# Patient Record
Sex: Male | Born: 1965 | Race: White | Hispanic: No | Marital: Married | Smoking: Current every day smoker
Health system: Southern US, Community
[De-identification: ages and names within clinical notes are randomized; demographics above are authoritative.]

---

## 2010-02-25 ENCOUNTER — Emergency Department: Payer: Self-pay | Admitting: Emergency Medicine

## 2012-10-09 ENCOUNTER — Ambulatory Visit: Payer: Self-pay

## 2013-06-22 ENCOUNTER — Emergency Department: Payer: Self-pay | Admitting: Internal Medicine

## 2013-06-22 LAB — COMPREHENSIVE METABOLIC PANEL
Albumin: 3.7 g/dL (ref 3.4–5.0)
Alkaline Phosphatase: 65 U/L
Anion Gap: 6 — ABNORMAL LOW (ref 7–16)
BUN: 19 mg/dL — ABNORMAL HIGH (ref 7–18)
Bilirubin,Total: 0.5 mg/dL (ref 0.2–1.0)
Co2: 30 mmol/L (ref 21–32)
Creatinine: 1.13 mg/dL (ref 0.60–1.30)
Glucose: 113 mg/dL — ABNORMAL HIGH (ref 65–99)
Potassium: 3.9 mmol/L (ref 3.5–5.1)
SGPT (ALT): 35 U/L (ref 12–78)
Total Protein: 6.6 g/dL (ref 6.4–8.2)

## 2013-06-22 LAB — URINALYSIS, COMPLETE
Bacteria: NONE SEEN
Bilirubin,UR: NEGATIVE
Blood: NEGATIVE
Glucose,UR: NEGATIVE mg/dL (ref 0–75)
Ketone: NEGATIVE
Ph: 8 (ref 4.5–8.0)
Protein: NEGATIVE
Squamous Epithelial: <1

## 2013-06-22 LAB — CBC
HCT: 45.6 % (ref 40.0–52.0)
HGB: 15.9 g/dL (ref 13.0–18.0)
MCHC: 34.9 g/dL (ref 32.0–36.0)
Platelet: 154 10*3/uL (ref 150–440)
RBC: 5.1 10*6/uL (ref 4.40–5.90)
RDW: 12.2 % (ref 11.5–14.5)

## 2013-06-22 LAB — CK TOTAL AND CKMB (NOT AT ARMC): CK-MB: 0.6 ng/mL (ref 0.5–3.6)

## 2013-06-22 LAB — TROPONIN I: Troponin-I: <0.02 ng/mL

## 2013-06-22 LAB — RAPID INFLUENZA A&B ANTIGENS

## 2015-03-19 ENCOUNTER — Other Ambulatory Visit: Payer: Self-pay | Admitting: Physician Assistant

## 2015-03-19 DIAGNOSIS — R1031 Right lower quadrant pain: Secondary | ICD-10-CM

## 2015-03-20 ENCOUNTER — Other Ambulatory Visit: Payer: Self-pay | Admitting: Physician Assistant

## 2015-03-20 ENCOUNTER — Ambulatory Visit
Admission: RE | Admit: 2015-03-20 | Discharge: 2015-03-20 | Disposition: A | Discharge status: Home / Self Care | Payer: Managed Care, Other (non HMO) | Source: Ambulatory Visit | Attending: Physician Assistant | Admitting: Physician Assistant

## 2015-03-20 DIAGNOSIS — R1031 Right lower quadrant pain: Secondary | ICD-10-CM

## 2015-09-16 ENCOUNTER — Other Ambulatory Visit: Payer: Self-pay | Admitting: Internal Medicine

## 2015-09-16 DIAGNOSIS — R0781 Pleurodynia: Secondary | ICD-10-CM

## 2015-09-17 ENCOUNTER — Ambulatory Visit
Admission: RE | Admit: 2015-09-17 | Discharge: 2015-09-17 | Disposition: A | Discharge status: Home / Self Care | Payer: Managed Care, Other (non HMO) | Source: Ambulatory Visit | Attending: Internal Medicine | Admitting: Internal Medicine

## 2015-09-17 ENCOUNTER — Other Ambulatory Visit: Payer: Self-pay | Admitting: Internal Medicine

## 2015-09-17 DIAGNOSIS — R932 Abnormal findings on diagnostic imaging of liver and biliary tract: Secondary | ICD-10-CM | POA: Diagnosis not present

## 2015-09-17 DIAGNOSIS — E782 Mixed hyperlipidemia: Secondary | ICD-10-CM | POA: Insufficient documentation

## 2015-09-17 DIAGNOSIS — R0781 Pleurodynia: Secondary | ICD-10-CM | POA: Diagnosis not present

## 2015-12-04 ENCOUNTER — Encounter: Payer: Self-pay | Admitting: *Deleted

## 2015-12-07 ENCOUNTER — Ambulatory Visit
Admission: RE | Admit: 2015-12-07 | Discharge: 2015-12-07 | Disposition: A | Discharge status: Home / Self Care | Payer: Managed Care, Other (non HMO) | Source: Ambulatory Visit | Attending: Gastroenterology | Admitting: Gastroenterology

## 2015-12-07 ENCOUNTER — Encounter
Admission: RE | Disposition: A | Discharge status: Home / Self Care | Payer: Self-pay | Source: Ambulatory Visit | Attending: Gastroenterology

## 2015-12-07 ENCOUNTER — Encounter: Payer: Self-pay | Admitting: *Deleted

## 2015-12-07 ENCOUNTER — Ambulatory Visit: Payer: Managed Care, Other (non HMO) | Admitting: Certified Registered Nurse Anesthetist

## 2015-12-07 DIAGNOSIS — Z791 Long term (current) use of non-steroidal anti-inflammatories (NSAID): Secondary | ICD-10-CM | POA: Insufficient documentation

## 2015-12-07 DIAGNOSIS — Z79899 Other long term (current) drug therapy: Secondary | ICD-10-CM | POA: Insufficient documentation

## 2015-12-07 DIAGNOSIS — D122 Benign neoplasm of ascending colon: Secondary | ICD-10-CM | POA: Diagnosis not present

## 2015-12-07 DIAGNOSIS — K635 Polyp of colon: Secondary | ICD-10-CM | POA: Insufficient documentation

## 2015-12-07 DIAGNOSIS — F17200 Nicotine dependence, unspecified, uncomplicated: Secondary | ICD-10-CM | POA: Insufficient documentation

## 2015-12-07 DIAGNOSIS — Z1211 Encounter for screening for malignant neoplasm of colon: Secondary | ICD-10-CM | POA: Diagnosis not present

## 2015-12-07 HISTORY — PX: COLONOSCOPY WITH PROPOFOL: SHX5780

## 2015-12-07 SURGERY — COLONOSCOPY WITH PROPOFOL
Anesthesia: General

## 2015-12-07 MED ORDER — SODIUM CHLORIDE 0.9 % IV SOLN
INTRAVENOUS | Status: DC
  Administered 2015-12-07: 10:00:00 via INTRAVENOUS

## 2015-12-07 MED ORDER — PROPOFOL 10 MG/ML IV BOLUS
INTRAVENOUS | Status: DC | PRN
  Administered 2015-12-07: 80 mg via INTRAVENOUS
  Administered 2015-12-07: 20 mg via INTRAVENOUS

## 2015-12-07 MED ORDER — PROPOFOL 500 MG/50ML IV EMUL
INTRAVENOUS | Status: DC | PRN
  Administered 2015-12-07: 160 ug/kg/min via INTRAVENOUS

## 2015-12-07 MED ORDER — SODIUM CHLORIDE 0.9 % IV SOLN: INTRAVENOUS | Status: DC

## 2015-12-07 MED ORDER — LIDOCAINE HCL (CARDIAC) 20 MG/ML IV SOLN
INTRAVENOUS | Status: DC | PRN
  Administered 2015-12-07: 20 mg via INTRAVENOUS

## 2015-12-07 NOTE — Op Note (Signed)
[facility] Gastroenterology Patient Name: Luke Jordan Procedure Date: 12/07/2015 10:31 AM MRN: [id number] Account #: [id number] Date of Birth: January 03, 1966 Admit Type: Outpatient Age: 50 Room: [facility] ENDO ROOM 4 Gender: Male Note Status: Finalized Procedure:            Colonoscopy Indications:          Screening for colorectal malignant neoplasm Providers:            Lupita Dawn. Candace Cruise, MD Referring MD:         Tracie Harrier, MD (Referring MD) Medicines:            Monitored Anesthesia Care Complications:        No immediate complications. Procedure:            Pre-Anesthesia Assessment:                       - Prior to the procedure, a History and Physical was                        performed, and patient medications, allergies and                        sensitivities were reviewed. The patient's tolerance of                        previous anesthesia was reviewed.                       - The risks and benefits of the procedure and the                        sedation options and risks were discussed with the                        patient. All questions were answered and informed                        consent was obtained.                       - After reviewing the risks and benefits, the patient                        was deemed in satisfactory condition to undergo the                        procedure.                       After obtaining informed consent, the colonoscope was                        passed under direct vision. Throughout the procedure,                        the patient's blood pressure, pulse, and oxygen                        saturations were monitored continuously. The  Colonoscope was introduced through the anus and                        advanced to the the cecum, identified by appendiceal                        orifice and ileocecal valve. The colonoscopy was                        performed without difficulty. The  patient tolerated the                        procedure well. The quality of the bowel preparation                        was good. Findings:      A small polyp was found in the proximal ascending colon. The polyp was       sessile. The polyp was removed with a hot snare. Resection and retrieval       were complete.      A few sessile polyps were found in the recto-sigmoid colon. The polyps       were diminutive in size. These polyps were removed with a jumbo cold       forceps. Polyp resection was incomplete. The resected tissue was       retrieved. These polyps in rectosigmoid area appeared to be hyperplastic       in nature      The exam was otherwise normal throughout the examined colon. Impression:           - One small polyp in the proximal ascending colon,                        removed with a hot snare. Resected and retrieved.                       - A few diminutive polyps at the recto-sigmoid colon,                        removed with a jumbo cold forceps. Incomplete                        resection. Resected tissue retrieved. Recommendation:       - Discharge patient to home.                       - Await pathology results.                       - Repeat colonoscopy in 5 years for surveillance based                        on pathology results.                       - The findings and recommendations were discussed with                        the patient. Procedure Code(s):    --- Professional ---  45385, Colonoscopy, flexible; with removal of tumor(s),                        polyp(s), or other lesion(s) by snare technique                       45380, 59, Colonoscopy, flexible; with biopsy, single                        or multiple Diagnosis Code(s):    --- Professional ---                       Z12.11, Encounter for screening for malignant neoplasm                        of colon                       D12.2, Benign neoplasm of ascending colon                        D12.7, Benign neoplasm of rectosigmoid junction CPT copyright 2016 American Medical Association. All rights reserved. The codes documented in this report are preliminary and upon coder review may  be revised to meet current compliance requirements. Hulen Luster, MD 12/07/2015 10:56:15 AM This report has been signed electronically. Number of Addenda: 0 Note Initiated On: 12/07/2015 10:31 AM Scope Withdrawal Time: 0 hours 6 minutes 37 seconds  Total Procedure Duration: 0 hours 10 minutes 14 seconds       [facility]

## 2015-12-07 NOTE — Anesthesia Postprocedure Evaluation (Signed)
Anesthesia Post Note  Patient: Doctor, hospital  Procedure(s) Performed: Procedure(s) (LRB): COLONOSCOPY WITH PROPOFOL (N/A)  Patient location during evaluation: PACU Anesthesia Type: General Level of consciousness: awake and alert Pain management: pain level controlled Vital Signs Assessment: post-procedure vital signs reviewed and stable Respiratory status: spontaneous breathing, nonlabored ventilation, respiratory function stable and patient connected to nasal cannula oxygen Cardiovascular status: blood pressure returned to baseline and stable Postop Assessment: no signs of nausea or vomiting Anesthetic complications: no    Last Vitals:  Filed Vitals:   12/07/15 1120 12/07/15 1130  BP: 104/75 138/92  Pulse: 69 64  Temp:    Resp: 17 14    Last Pain: There were no vitals filed for this visit.               Molli Barrows

## 2015-12-07 NOTE — Anesthesia Preprocedure Evaluation (Signed)
Anesthesia Evaluation  Patient identified by MRN, date of birth, ID band Patient awake    Reviewed: Allergy & Precautions, H&P , NPO status , Patient's Chart, lab work & pertinent test results, reviewed documented beta blocker date and time   Airway Mallampati: II   Neck ROM: full    Dental  (+) Teeth Intact   Pulmonary neg pulmonary ROS, Current Smoker,    Pulmonary exam normal        Cardiovascular negative cardio ROS Normal cardiovascular exam Rate:Normal     Neuro/Psych negative neurological ROS  negative psych ROS   GI/Hepatic negative GI ROS, Neg liver ROS,   Endo/Other  negative endocrine ROS  Renal/GU negative Renal ROS  negative genitourinary   Musculoskeletal   Abdominal   Peds  Hematology negative hematology ROS (+)   Anesthesia Other Findings History reviewed. No pertinent past medical history. History reviewed. No pertinent surgical history. BMI    Body Mass Index   31.11 kg/m 2     Reproductive/Obstetrics                             Anesthesia Physical Anesthesia Plan  ASA: III  Anesthesia Plan: General   Post-op Pain Management:    Induction:   Airway Management Planned:   Additional Equipment:   Intra-op Plan:   Post-operative Plan:   Informed Consent: I have reviewed the patients History and Physical, chart, labs and discussed the procedure including the risks, benefits and alternatives for the proposed anesthesia with the patient or authorized representative who has indicated his/her understanding and acceptance.   Dental Advisory Given  Plan Discussed with: CRNA  Anesthesia Plan Comments:         Anesthesia Quick Evaluation

## 2015-12-07 NOTE — H&P (Signed)
    Primary Care Physician:  Tracie Harrier, MD Primary Gastroenterologist:  Dr. Candace Cruise  Pre-Procedure History & Physical: HPI:  Luke Jordan is a 50 y.o. male is here for an colonoscopy.   History reviewed. No pertinent past medical history.  History reviewed. No pertinent past surgical history.  Prior to Admission medications   Medication Sig Start Date End Date Taking? Authorizing Provider  ALPRAZolam Duanne Moron) 0.5 MG tablet Take 0.5 mg by mouth at bedtime as needed for anxiety.   Yes Historical Provider, MD  meloxicam (MOBIC) 15 MG tablet Take 15 mg by mouth daily.   Yes Historical Provider, MD  Multiple Vitamin (MULTIVITAMIN) tablet Take 1 tablet by mouth daily.   Yes Historical Provider, MD  varenicline (CHANTIX PAK) 0.5 MG X 11 & 1 MG X 42 tablet Take by mouth 2 (two) times daily. Take one 0.5 mg tablet by mouth once daily for 3 days, then increase to one 0.5 mg tablet twice daily for 4 days, then increase to one 1 mg tablet twice daily.   Yes Historical Provider, MD    Allergies as of 11/17/2015  . (Not on File)    History reviewed. No pertinent family history.  Social History   Social History  . Marital Status: Married    Spouse Name: N/A  . Number of Children: N/A  . Years of Education: N/A   Occupational History  . Not on file.   Social History Main Topics  . Smoking status: Current Every Day Smoker  . Smokeless tobacco: Never Used  . Alcohol Use: No  . Drug Use: No  . Sexual Activity: Not on file   Other Topics Concern  . Not on file   Social History Narrative    Review of Systems: See HPI, otherwise negative ROS  Physical Exam: BP 125/71 mmHg  Pulse 79  Temp(Src) 97.2 F (36.2 C) (Tympanic)  Resp 14  Ht 5\' 5"  (1.651 m)  Wt 187 lb (84.823 kg)  BMI 31.12 kg/m2  SpO2 99% General:   Alert,  pleasant and cooperative in NAD Head:  Normocephalic and atraumatic. Neck:  Supple; no masses or thyromegaly. Lungs:  Clear throughout to auscultation.     Heart:  Regular rate and rhythm. Abdomen:  Soft, nontender and nondistended. Normal bowel sounds, without guarding, and without rebound.   Neurologic:  Alert and  oriented x4;  grossly normal neurologically.  Impression/Plan: Luke Jordan is here for an colonoscopy to be performed for screening.  Risks, benefits, limitations, and alternatives regarding  colonoscopy have been reviewed with the patient.  Questions have been answered.  All parties agreeable.   Violet Seabury, Lupita Dawn, MD  12/07/2015, 10:06 AM

## 2015-12-07 NOTE — Transfer of Care (Signed)
Immediate Anesthesia Transfer of Care Note  Patient: Luke Jordan  Procedure(s) Performed: Procedure(s): COLONOSCOPY WITH PROPOFOL (N/A)  Patient Location: PACU  Anesthesia Type:General  Level of Consciousness: sedated  Airway & Oxygen Therapy: Patient Spontanous Breathing and Patient connected to nasal cannula oxygen  Post-op Assessment: Report given to RN and Post -op Vital signs reviewed and stable  Post vital signs: Reviewed and stable  Last Vitals:  Filed Vitals:   12/07/15 0959 12/07/15 1058  BP: 125/71 85/59  Pulse: 79 77  Temp: 36.2 C 35.8 C  Resp: 14 17    Last Pain: There were no vitals filed for this visit.       Complications: No apparent anesthesia complications

## 2015-12-08 ENCOUNTER — Encounter: Payer: Self-pay | Admitting: Gastroenterology

## 2015-12-08 LAB — SURGICAL PATHOLOGY

## 2017-01-13 ENCOUNTER — Encounter (HOSPITAL_COMMUNITY): Payer: Self-pay

## 2017-01-13 ENCOUNTER — Emergency Department (HOSPITAL_COMMUNITY): Payer: Managed Care, Other (non HMO)

## 2017-01-13 ENCOUNTER — Emergency Department (HOSPITAL_COMMUNITY)
Admission: EM | Admit: 2017-01-13 | Discharge: 2017-01-13 | Disposition: A | Discharge status: Home / Self Care | Payer: Managed Care, Other (non HMO) | Attending: Emergency Medicine | Admitting: Emergency Medicine

## 2017-01-13 DIAGNOSIS — Z79899 Other long term (current) drug therapy: Secondary | ICD-10-CM | POA: Insufficient documentation

## 2017-01-13 DIAGNOSIS — F458 Other somatoform disorders: Secondary | ICD-10-CM | POA: Insufficient documentation

## 2017-01-13 DIAGNOSIS — R61 Generalized hyperhidrosis: Secondary | ICD-10-CM | POA: Insufficient documentation

## 2017-01-13 DIAGNOSIS — R030 Elevated blood-pressure reading, without diagnosis of hypertension: Secondary | ICD-10-CM | POA: Diagnosis not present

## 2017-01-13 DIAGNOSIS — R51 Headache: Secondary | ICD-10-CM | POA: Insufficient documentation

## 2017-01-13 DIAGNOSIS — F17200 Nicotine dependence, unspecified, uncomplicated: Secondary | ICD-10-CM | POA: Insufficient documentation

## 2017-01-13 DIAGNOSIS — R0989 Other specified symptoms and signs involving the circulatory and respiratory systems: Secondary | ICD-10-CM

## 2017-01-13 DIAGNOSIS — R0789 Other chest pain: Secondary | ICD-10-CM | POA: Diagnosis present

## 2017-01-13 LAB — BASIC METABOLIC PANEL
ANION GAP: 6 (ref 5–15)
BUN: 15 mg/dL (ref 6–20)
CALCIUM: 8.9 mg/dL (ref 8.9–10.3)
CO2: 24 mmol/L (ref 22–32)
Chloride: 112 mmol/L — ABNORMAL HIGH (ref 101–111)
Creatinine, Ser: 1.04 mg/dL (ref 0.61–1.24)
GFR calc Af Amer: >60 mL/min (ref >60)
GLUCOSE: 98 mg/dL (ref 65–99)
POTASSIUM: 3.9 mmol/L (ref 3.5–5.1)
SODIUM: 142 mmol/L (ref 135–145)

## 2017-01-13 LAB — I-STAT TROPONIN, ED
TROPONIN I, POC: 0 ng/mL (ref 0.00–0.08)
Troponin i, poc: 0 ng/mL (ref 0.00–0.08)

## 2017-01-13 LAB — CBC
HEMATOCRIT: 45.8 % (ref 39.0–52.0)
HEMOGLOBIN: 15.3 g/dL (ref 13.0–17.0)
MCH: 30.6 pg (ref 26.0–34.0)
MCHC: 33.4 g/dL (ref 30.0–36.0)
MCV: 91.6 fL (ref 78.0–100.0)
Platelets: 155 10*3/uL (ref 150–400)
RBC: 5 MIL/uL (ref 4.22–5.81)
RDW: 12.3 % (ref 11.5–15.5)
WBC: 5.6 10*3/uL (ref 4.0–10.5)

## 2017-01-13 NOTE — ED Triage Notes (Signed)
Pt presents for evaluation of "soreness" to neck and mid chest x 1 week. Reports very mild. States took BP this AM and it was high. Reports is daily smoker. Denies hx of medication for HTN or HLD.

## 2017-01-13 NOTE — Discharge Instructions (Signed)
If you have any additional episodes of sweating, chest discomfort, numbness, tingling, weakness, or shortness of breath, please return to the emergency department for reevaluation. You can call your ear nose and throat physician to schedule a follow-up appointment about the discomfort in your throat. You can also follow-up with your primary care provider to have your thyroid checked. Please continue to take her blood pressure once daily at home and keep a log of the readings.

## 2017-01-13 NOTE — ED Provider Notes (Signed)
[facility] DEPT Provider Note   CSN: [id number] Arrival date & time: 01/13/17  1135     History   Chief Complaint Chief Complaint  Patient presents with  . Chest Pain    HPI Luke Jordan is a 51 y.o. male who presents to the emergency department with a chief complaint of hypertension. He reports that he was at work hanging a Biochemist, clinical on the wall when he suddenly felt "strange" with associated diaphoresis. He reports that a nurse at work took his blood pressure, which was in the 170s/100s. She rechecked his blood pressure apartment 10 minutes later and obtained a reading of 182/112. He reports that the episode lasted approximately 30 minutes and then resolved on its own. He reports that his blood pressure typically runs in the 120s/80s and was concerned so he presented to the emergency department for evaluation. He denies dyspnea or palpitations.   He also presents with a history of globus sensation 1 week with associated soreness to the anterior neck and superior chest. He states that the symptoms began suddenly after he almost choked on an Oreo cookie. He denies dysphagia, cough, hematemesis, hemoptysis, N/V/D, or abdominal pain.     No chronic medical problems. No daily medications. He reports that his father had an MI in his 58s. No CV history with his mother. He reports that he used to be a one pack per day smoker, but has recently been cutting back in trying to quit. 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 He reports a death in the family one week ago and increased hours and stress at work.  The history is provided by the patient. No language interpreter was used.    History reviewed. No pertinent past medical history.  There are no active problems to display for this patient.   Past Surgical History:  Procedure Laterality Date  . COLONOSCOPY WITH PROPOFOL N/A 12/07/2015   Procedure: COLONOSCOPY WITH PROPOFOL;  Surgeon: Hulen Luster, MD;   Location: [facility] ENDOSCOPY;  Service: Gastroenterology;  Laterality: N/A;       Home Medications    Prior to Admission medications   Medication Sig Start Date End Date Taking? Authorizing Provider  ALPRAZolam Duanne Moron) 0.5 MG tablet Take 0.5 mg by mouth at bedtime as needed for anxiety.    [provider]  meloxicam (MOBIC) 15 MG tablet Take 15 mg by mouth daily.    [provider]  Multiple Vitamin (MULTIVITAMIN) tablet Take 1 tablet by mouth daily.    [provider]  varenicline (CHANTIX PAK) 0.5 MG X 11 & 1 MG X 42 tablet Take by mouth 2 (two) times daily. Take one 0.5 mg tablet by mouth once daily for 3 days, then increase to one 0.5 mg tablet twice daily for 4 days, then increase to one 1 mg tablet twice daily.    [provider]    Family History No family history on file.  Social History Social History  Substance Use Topics  . Smoking status: Current Every Day Smoker  . Smokeless tobacco: Never Used  . Alcohol use No     Allergies   Penicillins   Review of Systems Review of Systems  Constitutional: Positive for diaphoresis. Negative for activity change, chills and fever.  HENT: Negative for trouble swallowing.        Globus sensation  Eyes: Negative for visual disturbance.  Respiratory: Negative for shortness of breath.   Cardiovascular: Positive for chest pain. Negative  for palpitations.  Gastrointestinal: Negative for abdominal pain, diarrhea, nausea and vomiting.  Genitourinary: Negative for dysuria.  Musculoskeletal: Positive for neck pain. Negative for back pain and neck stiffness.  Skin: Negative for rash.  Allergic/Immunologic: Negative for immunocompromised state.  Neurological: Positive for headaches. Negative for dizziness and light-headedness.   Physical Exam Updated Vital Signs BP 126/76   Pulse 65   Temp 97.8 F (36.6 C) (Oral)   Resp (!) 24   Ht 5\' 5"  (1.651 m)   Wt 83.9 kg (185 lb)   SpO2 99%   BMI 30.79  kg/m   Physical Exam  Constitutional: He appears well-developed. No distress.  HENT:  Head: Normocephalic.  Eyes: Conjunctivae are normal.  Neck: Trachea normal, normal range of motion and full passive range of motion without pain. Neck supple. Normal carotid pulses and no JVD present. No tracheal tenderness and no spinous process tenderness present. Carotid bruit is not present. No neck rigidity. No tracheal deviation, no edema, no erythema and normal range of motion present. No thyroid mass and no thyromegaly present.  Full range of motion with lateral flexion, flexion, extension, and rotation of the neck. No JVD noted. Carotid pulses are 2+ bilaterally.  Cardiovascular: Normal rate, regular rhythm, normal heart sounds and intact distal pulses.  Exam reveals no friction rub.   No murmur heard. Pulmonary/Chest: Effort normal. No stridor. No respiratory distress. He has no rales. He exhibits no tenderness.  Intermittent expiratory wheeze in the mid-left lung. All other fields are clear to auscultation. No TTP over the chest wall, ribs, sternum, or bilateral clavicles. No supraclavicular lymphadenopathy.   Abdominal: Soft. Bowel sounds are normal. He exhibits no distension and no mass. There is no tenderness. There is no rebound and no guarding. No hernia.  Protuberant abdomen  Musculoskeletal: Normal range of motion. He exhibits no tenderness.  Lymphadenopathy:    He has no cervical adenopathy.  Neurological: He is alert.  Skin: Skin is warm and dry. Capillary refill takes less than 2 seconds. He is not diaphoretic.  Psychiatric: His behavior is normal.  Nursing note and vitals reviewed.  ED Treatments / Results  Labs (all labs ordered are listed, but only abnormal results are displayed) Labs Reviewed  BASIC METABOLIC PANEL - Abnormal; Notable for the following:       Result Value   Chloride 112 (*)    All other components within normal limits  CBC  I-STAT TROPOININ, ED  I-STAT  TROPOININ, ED    EKG  EKG Interpretation  Date/Time:  Friday January 13 2017 11:59:33 EDT Ventricular Rate:  71 PR Interval:  150 QRS Duration: 88 QT Interval:  380 QTC Calculation: 412 R Axis:   40 Text Interpretation:  Normal sinus rhythm Normal ECG Confirmed by Hazle Coca [phone]) on 01/13/2017 3:08:37 PM       Radiology Dg Chest 2 View  Result Date: 01/13/2017 CLINICAL DATA:  Hypertensive EXAM: CHEST  2 VIEW COMPARISON:  06/22/2013; 02/25/2010 FINDINGS: Grossly unchanged cardiac silhouette and mediastinal contours. No focal parenchymal opacities. No pleural effusion or pneumothorax. No evidence of edema. No acute osseus abnormalities. IMPRESSION: No acute cardiopulmonary disease. Electronically Signed   By: Sandi Mariscal M.D.   On: 01/13/2017 13:13    Procedures Procedures (including critical care time)  Medications Ordered in ED Medications - No data to display   Initial Impression / Assessment and Plan / ED Course  I have reviewed the triage vital signs and the nursing notes.  Pertinent labs &  imaging results that were available during my care of the patient were reviewed by me and considered in my medical decision making (see chart for details).     Patient presenting with an episode of hypertension and diaphoresis that lasted for approximately 30 minutes earlier today while he was at work hanging a Biochemist, clinical. He also complains of globus sensation, throat/chest discomfort, and headaches times one week. EKG unremarkable. Delta troponin negative. No electrolyte abnormalities noted on BMP. CBC is unremarkable. Chest x-ray demonstrates no acute cardiac or pulmonary pathology. Heart score = low risk. The patient was seen and evaluated with Dr. Ralene Bathe, attending physician. Will discharge the patient to home with follow-up to his ENT if globus sensation persists. Recommended following up with his primary care provider and possibly checking his thyroid as well. Vital signs stable. No  acute distress. The patient is stable for discharge at this time.  Final Clinical Impressions(s) / ED Diagnoses   Final diagnoses:  Elevated blood-pressure reading without diagnosis of hypertension  Globus sensation    New Prescriptions Discharge Medication List as of 01/13/2017  4:55 PM       Joanne Gavel, PA-C 01/14/17 Mayra Reel, MD 01/24/17 0930

## 2017-05-19 IMAGING — US US ABDOMEN LIMITED
1 series · 14 of 25 positions shown · non-contrast
Comparison: No prior.

CLINICAL DATA: Rib pain.

EXAM:
US ABDOMEN LIMITED - RIGHT UPPER QUADRANT

[Series 1: us abdomen limited · 0.22mm/px · 14 of 39 slices shown]
[im 1/39]
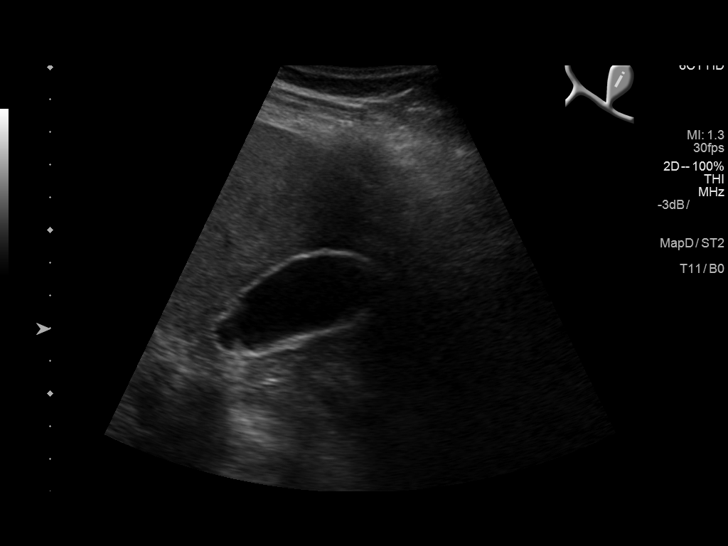
[im 4/39]
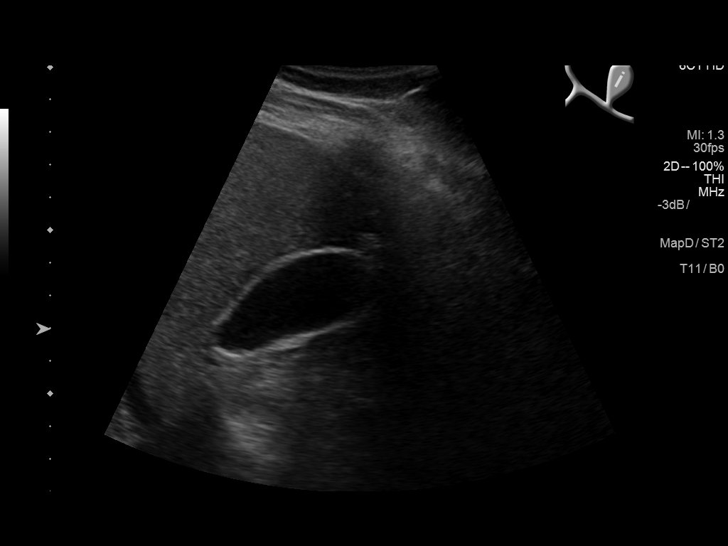
[im 7/39]
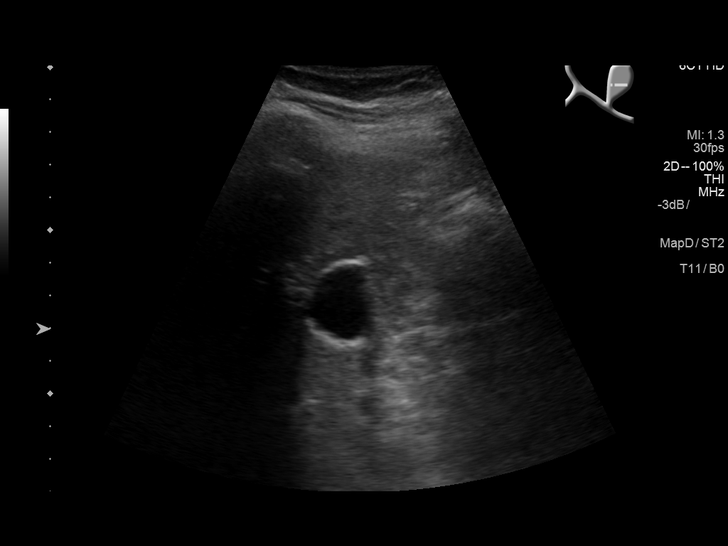
[im 10/39]
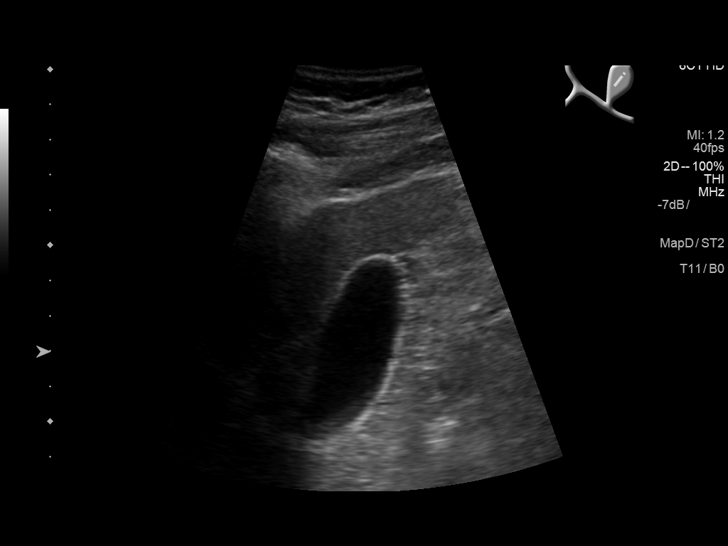
[im 13/39]
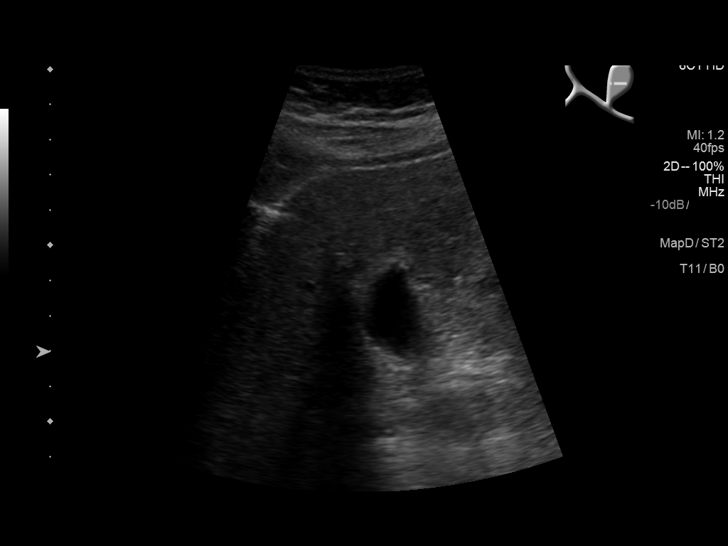
[im 15/39]
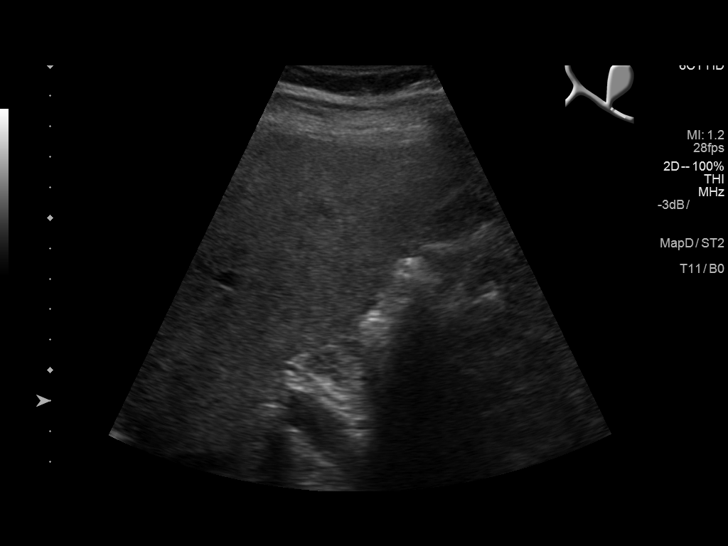
[im 18/39]
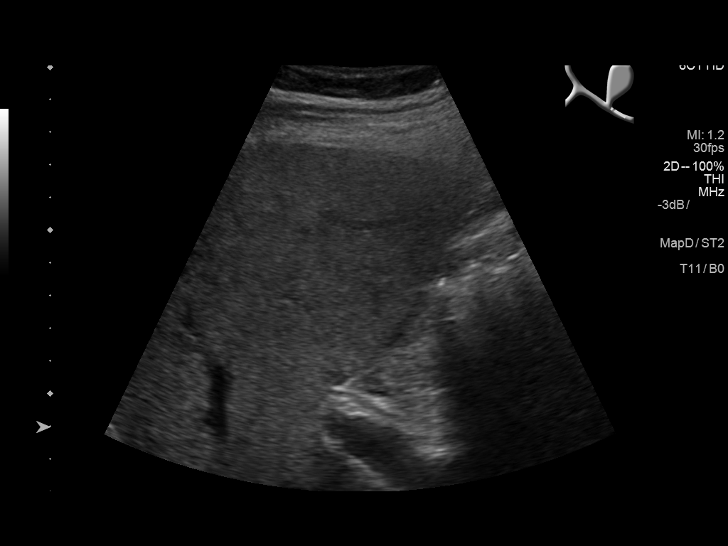
[im 21/39]
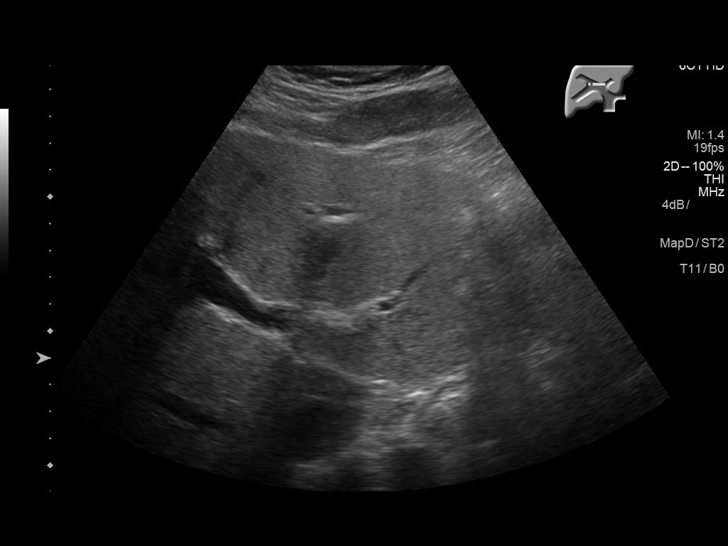
[im 24/39]
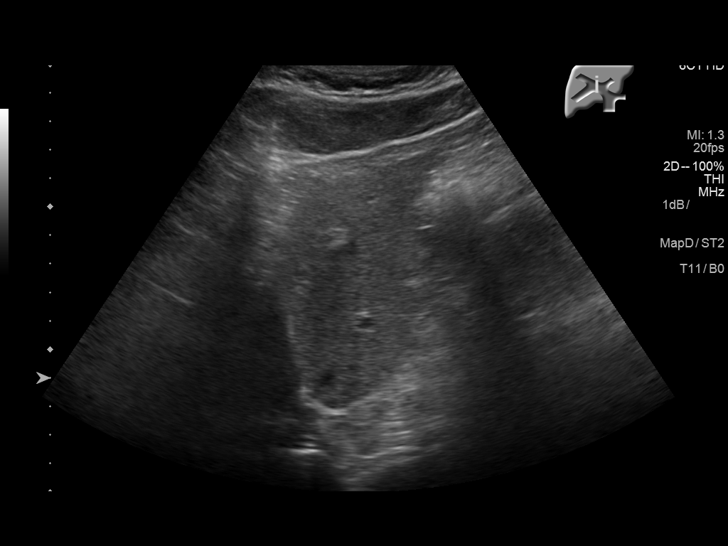
[im 26/39]
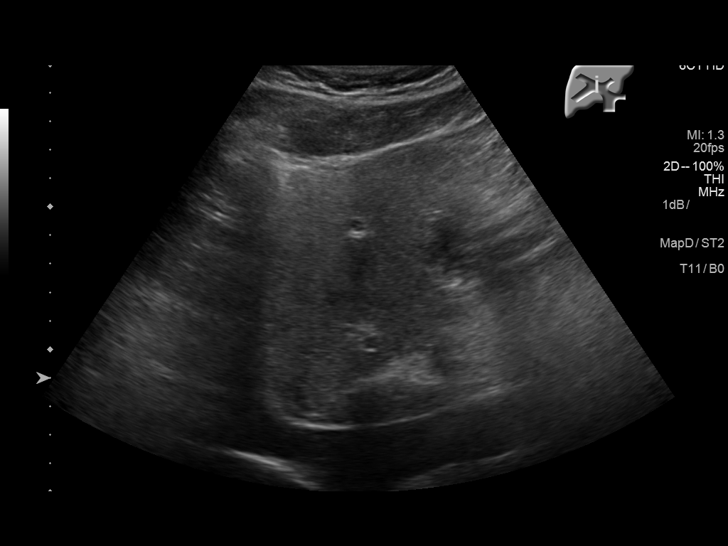
[im 29/39]
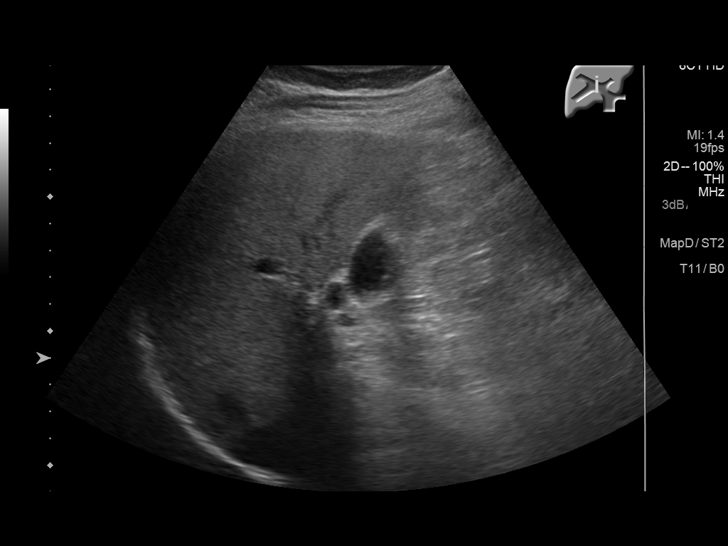
[im 32/39]
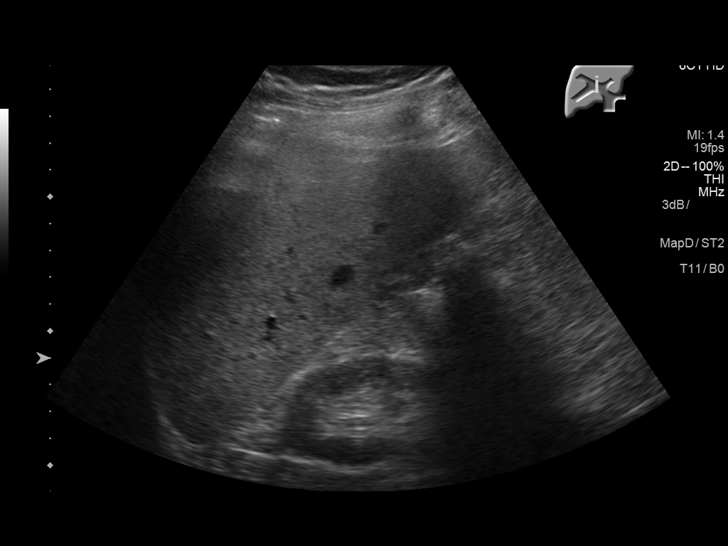
[im 35/39]
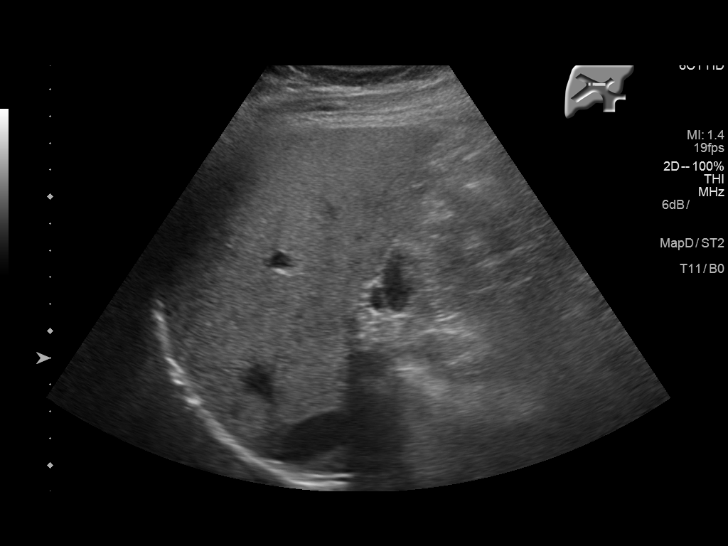
[im 39/39]
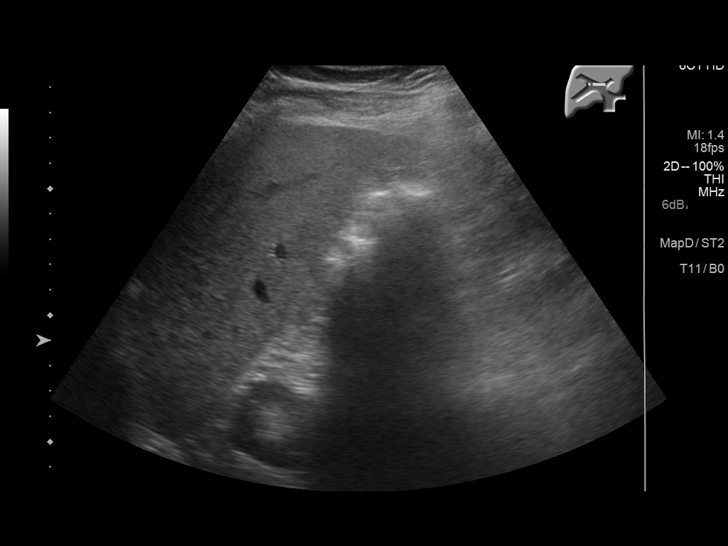

[14 of 25 positions shown; findings below may reference images not displayed]

FINDINGS: Gallbladder:

No gallstones or wall thickening visualized. No sonographic Murphy
sign noted by sonographer.

Common bile duct:

Diameter: 2.8 mm

Liver:

Increased echogenicity suggesting fatty infiltration and/or
hepatocellular disease. No focal hepatic abnormality identified.
IMPRESSION: 1. No evidence of gallstones or biliary distention.

2. Liver is slightly echogenic suggesting fatty infiltration and/ or
hepatocellular disease. No focal hepatic abnormality identified.

## 2017-09-25 IMAGING — DX DG CHEST 2V
2 series · 2 of 2 positions shown · non-contrast
Comparison: 06/22/2013; 02/25/2010

CLINICAL DATA: Hypertensive

EXAM:
CHEST  2 VIEW

[w chest pa]
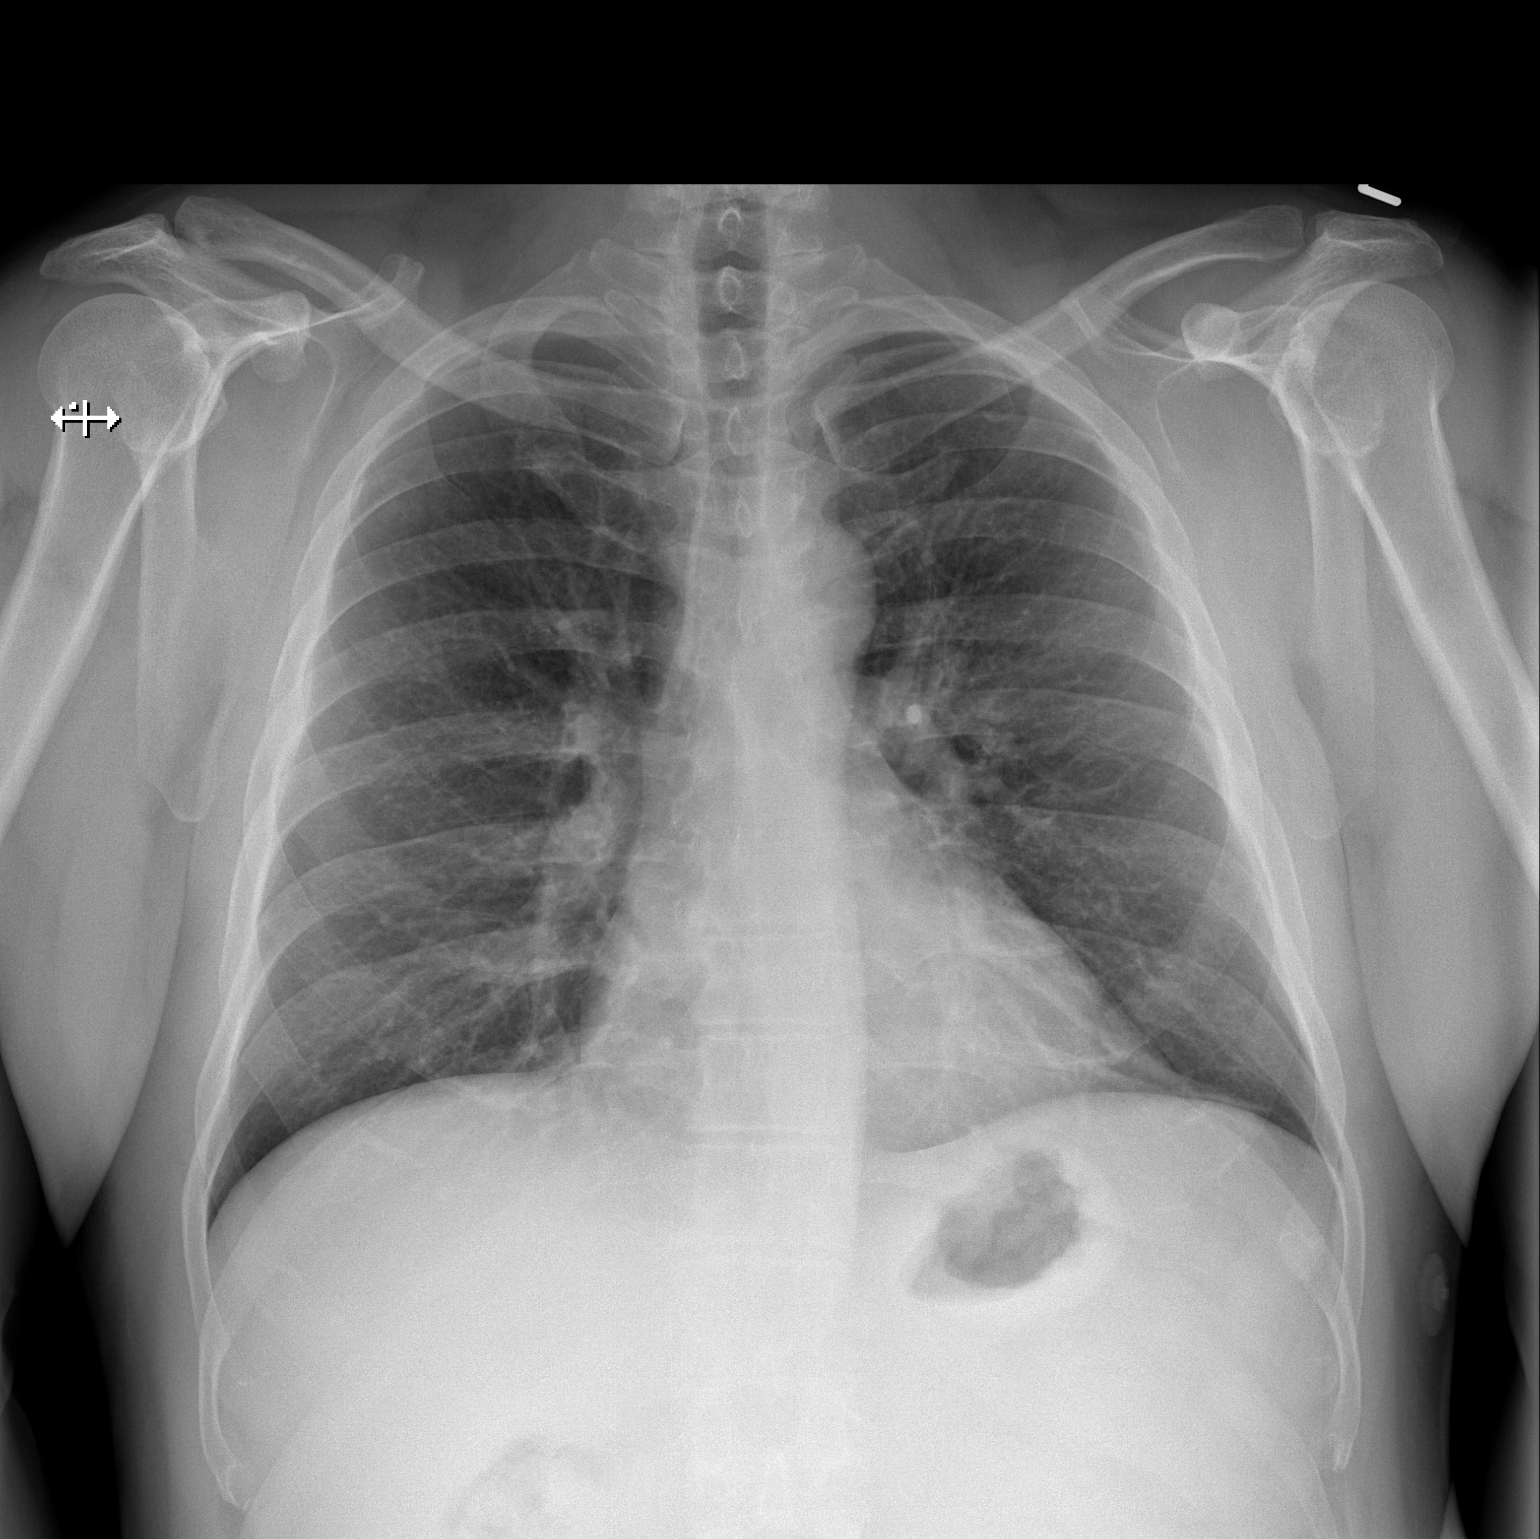

[w chest lat]
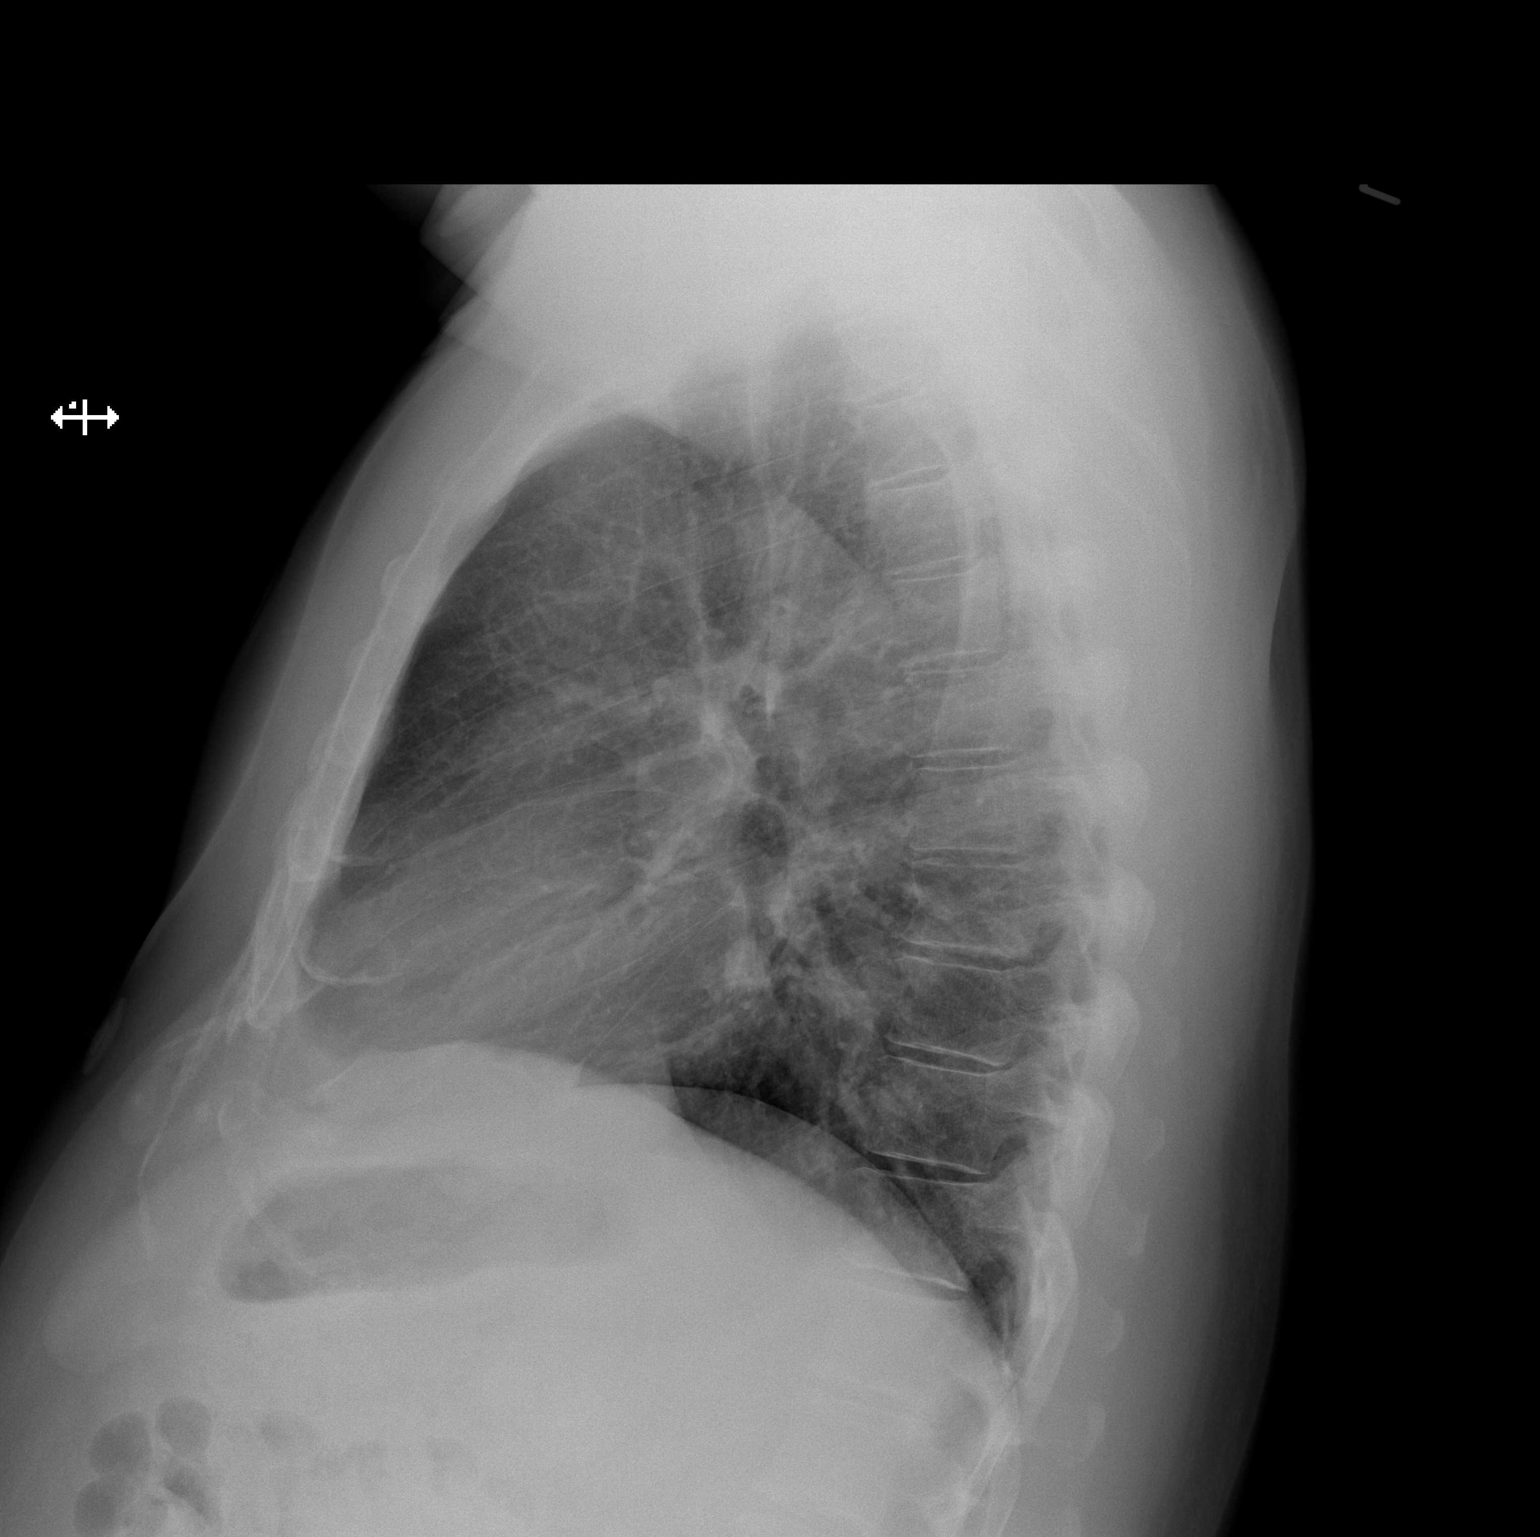

[2 of 2 positions shown; findings below may reference images not displayed]

FINDINGS: Grossly unchanged cardiac silhouette and mediastinal contours. No
focal parenchymal opacities. No pleural effusion or pneumothorax. No
evidence of edema. No acute osseus abnormalities.
IMPRESSION: No acute cardiopulmonary disease.

## 2018-02-08 ENCOUNTER — Emergency Department
Admission: EM | Admit: 2018-02-08 | Discharge: 2018-02-09 | Disposition: A | Discharge status: Home / Self Care | Payer: Self-pay | Attending: Emergency Medicine | Admitting: Emergency Medicine

## 2018-02-08 ENCOUNTER — Emergency Department: Payer: Self-pay

## 2018-02-08 DIAGNOSIS — Y9389 Activity, other specified: Secondary | ICD-10-CM | POA: Insufficient documentation

## 2018-02-08 DIAGNOSIS — M79601 Pain in right arm: Secondary | ICD-10-CM | POA: Insufficient documentation

## 2018-02-08 DIAGNOSIS — X509XXA Other and unspecified overexertion or strenuous movements or postures, initial encounter: Secondary | ICD-10-CM | POA: Insufficient documentation

## 2018-02-08 DIAGNOSIS — Z79899 Other long term (current) drug therapy: Secondary | ICD-10-CM | POA: Insufficient documentation

## 2018-02-08 DIAGNOSIS — Y999 Unspecified external cause status: Secondary | ICD-10-CM | POA: Insufficient documentation

## 2018-02-08 DIAGNOSIS — S46212A Strain of muscle, fascia and tendon of other parts of biceps, left arm, initial encounter: Secondary | ICD-10-CM

## 2018-02-08 DIAGNOSIS — S46221A Laceration of muscle, fascia and tendon of other parts of biceps, right arm, initial encounter: Secondary | ICD-10-CM | POA: Insufficient documentation

## 2018-02-08 DIAGNOSIS — Y9289 Other specified places as the place of occurrence of the external cause: Secondary | ICD-10-CM | POA: Insufficient documentation

## 2018-02-08 DIAGNOSIS — F17200 Nicotine dependence, unspecified, uncomplicated: Secondary | ICD-10-CM | POA: Insufficient documentation
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
## 2018-02-08 NOTE — ED Notes (Signed)
Pt eating [employer]

## 2018-02-08 NOTE — ED Provider Notes (Signed)
[facility] Emergency Department Provider Note   ____________________________________________   First MD Initiated Contact with Patient 02/08/18 2355     (approximate)  I have reviewed the triage vital signs and the nursing notes.   HISTORY  Chief Complaint Arm Pain    HPI Luke Jordan is a 52 y.o. male who presents to the ED from home with a chief complaint of left arm injury and pain.  Patient is right-hand dominant.  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  Patient tried to hold on as to trailer fell but could not.  Patient reports feeling 3 popping sensations in his left upper arm.  He did fall to the ground but denies striking head or LOC.  Complains of pain and swelling to his left upper arm.  Denies extremity weakness, numbness or tingling.  Denies headache, vision changes, neck pain, chest pain, shortness of breath, abdominal pain, nausea or vomiting.   Past medical history None  There are no active problems to display for this patient.   Past Surgical History:  Procedure Laterality Date  . COLONOSCOPY WITH PROPOFOL N/A 12/07/2015   Procedure: COLONOSCOPY WITH PROPOFOL;  Surgeon: Hulen Luster, MD;  Location: [facility] ENDOSCOPY;  Service: Gastroenterology;  Laterality: N/A;    Prior to Admission medications   Medication Sig Start Date End Date Taking? Authorizing Provider  ALPRAZolam Duanne Moron) 0.5 MG tablet Take 0.5 mg by mouth at bedtime as needed for anxiety.    [provider]  HYDROcodone-acetaminophen (NORCO) 5-325 MG tablet Take 1 tablet by mouth every 6 (six) hours as needed for moderate pain. 02/09/18   Paulette Blanch, MD  ibuprofen (ADVIL,MOTRIN) 800 MG tablet Take 1 tablet (800 mg total) by mouth every 8 (eight) hours as needed for moderate pain. 02/09/18   Paulette Blanch, MD  meloxicam (MOBIC) 15 MG tablet Take 15 mg by mouth daily.    [provider]  Multiple Vitamin (MULTIVITAMIN) tablet Take 1  tablet by mouth daily.    [provider]  varenicline (CHANTIX PAK) 0.5 MG X 11 & 1 MG X 42 tablet Take by mouth 2 (two) times daily. Take one 0.5 mg tablet by mouth once daily for 3 days, then increase to one 0.5 mg tablet twice daily for 4 days, then increase to one 1 mg tablet twice daily.    [provider]    Allergies Penicillins  No family history on file.  Social History Social History   Tobacco Use  . Smoking status: Current Every Day Smoker  . Smokeless tobacco: Never Used  Substance Use Topics  . Alcohol use: No  . Drug use: No    Review of Systems  Constitutional: No fever/chills Eyes: No visual changes. ENT: No sore throat. Cardiovascular: Denies chest pain. Respiratory: Denies shortness of breath. Gastrointestinal: No abdominal pain.  No nausea, no vomiting.  No diarrhea.  No constipation. Genitourinary: Negative for dysuria. Musculoskeletal: Positive for left upper arm pain.  Negative for back pain. Skin: Negative for rash. Neurological: Negative for headaches, focal weakness or numbness.   ____________________________________________   PHYSICAL EXAM:  VITAL SIGNS: ED Triage Vitals  Enc Vitals Group     BP 02/08/18 2249 (!) 144/81     Pulse Rate 02/08/18 2249 72     Resp 02/08/18 2249 18     Temp 02/08/18 2249 98.5 F (36.9 C)     Temp Source 02/08/18 2249 Oral  SpO2 02/08/18 2249 95 %     Weight 02/08/18 2250 190 lb (86.2 kg)     Height 02/08/18 2250 5\' 5"  (1.651 m)     Head Circumference --      Peak Flow --      Pain Score 02/08/18 2250 5     Pain Loc --      Pain Edu? --      Excl. in [place]? --     Constitutional: Alert and oriented. Well appearing and in mild acute distress. Eyes: Conjunctivae are normal. PERRL. EOMI. Head: Atraumatic. Nose: No congestion/rhinnorhea. Mouth/Throat: Mucous membranes are moist.  Oropharynx non-erythematous. Neck: No stridor.  No cervical spine tenderness to palpation. Cardiovascular:  Normal rate, regular rhythm. Grossly normal heart sounds.  Good peripheral circulation. Respiratory: Normal respiratory effort.  No retractions. Lungs CTAB. Gastrointestinal: Soft and nontender. No distention. No abdominal bruits. No CVA tenderness. Musculoskeletal:  LUE: Wrist, elbow and shoulder nontender to palpation with full range of motion without pain.  Biceps mildly swollen and tender to palpation.  Painful on extension.  2+ radial pulse.  Brisk, less than 5-second capillary refill.  No lower extremity tenderness nor edema.  No joint effusions. Neurologic:  Normal speech and language. No gross focal neurologic deficits are appreciated. No gait instability. Skin:  Skin is warm, dry and intact. No rash noted. Psychiatric: Mood and affect are normal. Speech and behavior are normal.  ____________________________________________   LABS (all labs ordered are listed, but only abnormal results are displayed)  Labs Reviewed - No data to display ____________________________________________  EKG  None ____________________________________________  RADIOLOGY  ED MD interpretation:  No fracture or dislocation  Official radiology report(s): Dg Humerus Left  Result Date: 02/08/2018 CLINICAL DATA:  Left arm pain after injury. EXAM: LEFT HUMERUS - 2+ VIEW COMPARISON:  None. FINDINGS: There is no evidence of fracture or other focal bone lesions. Soft tissues are unremarkable. IMPRESSION: Normal left humerus. Electronically Signed   By: Marijo Conception, M.D.   On: 02/08/2018 23:23    ____________________________________________   PROCEDURES  Procedure(s) performed: None  Procedures  Critical Care performed: No  ____________________________________________   INITIAL IMPRESSION / ASSESSMENT AND PLAN / ED COURSE  As part of my medical decision making, I reviewed the following data within the [place] History obtained from family, Nursing notes reviewed and  incorporated, Old chart reviewed, Radiograph reviewed and Notes from prior ED visits   52 year old male who presents with left biceps injury/pain most likely secondary to tendon tear or rupture.  Will place in sling, prescribed NSAIDs, opioid analgesia and follow-up with orthopedics.  Strict return precautions given.  All verbalize understanding and agree with plan of care.      ____________________________________________   FINAL CLINICAL IMPRESSION(S) / ED DIAGNOSES  Final diagnoses:  Right arm pain  Biceps muscle tear, left, initial encounter     ED Discharge Orders        Ordered    ibuprofen (ADVIL,MOTRIN) 800 MG tablet  Every 8 hours PRN     02/09/18 0033    HYDROcodone-acetaminophen (NORCO) 5-325 MG tablet  Every 6 hours PRN     02/09/18 0033       Note:  This document was prepared using Dragon voice recognition software and may include unintentional dictation errors.    Paulette Blanch, MD 02/09/18 0500

## 2018-02-09 MED ORDER — IBUPROFEN 800 MG PO TABS: 800.0000 mg | ORAL_TABLET | Freq: Three times a day (TID) | ORAL | 0 refills | Status: DC | PRN

## 2018-02-09 MED ORDER — HYDROCODONE-ACETAMINOPHEN 5-325 MG PO TABS: 1.0000 | ORAL_TABLET | Freq: Four times a day (QID) | ORAL | 0 refills | Status: DC | PRN

## 2018-02-09 MED ORDER — HYDROCODONE-ACETAMINOPHEN 5-325 MG PO TABS
1.0000 | ORAL_TABLET | Freq: Once | ORAL | Status: AC
  Administered 2018-02-09: 1 via ORAL
  Filled 2018-02-09: qty 1

## 2018-02-09 MED ORDER — IBUPROFEN 800 MG PO TABS
800.0000 mg | ORAL_TABLET | Freq: Once | ORAL | Status: AC
  Administered 2018-02-09: 800 mg via ORAL
  Filled 2018-02-09: qty 1

## 2018-02-09 NOTE — Discharge Instructions (Addendum)
You may take pain medicines as needed (Motrin/Norco #15). Apply ice to affected area several times daily. Wear sling as needed for comfort. Return to the ER for worsening symptoms, increased swelling, numbness/tingling, extremity weakness or other concerns.

## 2018-07-27 ENCOUNTER — Encounter: Payer: Self-pay | Admitting: Internal Medicine

## 2018-07-27 LAB — PULMONARY FUNCTION TEST

## 2018-08-03 ENCOUNTER — Ambulatory Visit: Payer: Self-pay | Admitting: Internal Medicine

## 2018-08-03 ENCOUNTER — Encounter: Payer: Self-pay | Admitting: Internal Medicine

## 2018-08-03 VITALS — BP 122/80 | HR 80 | Ht 65.0 in | Wt 210.2 lb

## 2018-08-03 DIAGNOSIS — J42 Unspecified chronic bronchitis: Secondary | ICD-10-CM

## 2018-08-03 NOTE — Patient Instructions (Addendum)
Will send for lung function test, once you have this done call us back.   --Quitting smoking is the most important thing that you can do for your health.  --Quitting smoking will have greater affect on your health than any medicine that we can give you.

## 2018-08-03 NOTE — Progress Notes (Addendum)
[facility] Consultation      Assessment and Plan:  Chronic bronchitis. - Patient had abnormal spirometry, though he was having a chest cold when he performed this, therefore it may have been suboptimal. - Patient has minimal symptoms of dyspnea on exertion and does not require an inhaler at this time.  He could achieve a much greater benefit by smoking cessation.  Preemployment screening. - We will send for full lung function test.  He is asked to call us back after this is performed.  Nicotine abuse. - Patient does not have insurance, therefore Chantix and patches may be too expensive for him.  We did discuss smoke cessation. - He is trying to quit currently, and is down to only a few cigarettes per week.  He was given the number for a [place] quit line which may be able to provide him with nicotine patches.  PFT 08/07/2018 tracings personally reviewed. Postbronchodilator FVC is 83%, FEV1 is 72%, there is borderline improvement with bronchodilator.  Ratio is 69%.  TLC is 90% predicted, RV/TLC ratio is elevated 129%.  DLCO is normal at 98%.  Overall this test suggests mild obstruction with slight air trapping, normal DLCO, suggestive of mild obstructive lung disease such as mild COPD.  Results reviewed with patient via telephone, recommended smoking cessation.    Date: 08/03/2018  MRN# [id number] Luke Jordan [birth year]  Referring Physician: Dr. Ginette Pitman.  Luke Jordan is a 53 y.o. old male seen in consultation for chief complaint of:    Chief Complaint  Patient presents with  . pulmonary consult    recent spiro. pt states he is currently getting over a cold-head/nasal congestion, prod cogh with clear mucus. cold sx are improving with flonase & mucinex. pt reports of sob with heavy exertion & occ wheezing.    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 He had a screening  spirometry which he failed.  He has been having a cold and he had a cold when he did it. He has done spirometry reading in the past which were always low normal. 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  He is still smoking, but down to a few a day.  He has never been diagnosed with respiratory problems in the past.  He notes that he can do his usual daily activities without difficulty.  He he will occasionally develop mild dyspnea with heavy exertion but is otherwise not really bothered by dyspnea.  **Spirometry 07/27/2018>> FVC 69% predicted, FEV1 is 61% predicted, ratio is 77%.  This test suggests mild restriction. **Chest x-ray 01/13/2017>> images personally viewed, changes of chronic bronchitis, otherwise lungs are unremarkable.  PMHX:   No past medical history on file. Surgical Hx:  Past Surgical History:  Procedure Laterality Date  . COLONOSCOPY WITH PROPOFOL N/A 12/07/2015   Procedure: COLONOSCOPY WITH PROPOFOL;  Surgeon: Hulen Luster, MD;  Location: [facility] ENDOSCOPY;  Service: Gastroenterology;  Laterality: N/A;   Family Hx:  Family History  Problem Relation Age of Onset  . Liver cancer Mother    Social Hx:   Social History   Tobacco Use  . Smoking status: Current Every Day Smoker    Years: 40.00    Types: Cigarettes  . Smokeless tobacco: Never Used  . Tobacco comment: 6 cigarettes weekly  Substance Use Topics  . Alcohol use:  No  . Drug use: No   Medication:    Current Outpatient Medications:  .  HYDROcodone-acetaminophen (NORCO) 5-325 MG tablet, Take 1 tablet by mouth every 6 (six) hours as needed for moderate pain., Disp: 15 tablet, Rfl: 0 .  ibuprofen (ADVIL,MOTRIN) 800 MG tablet, Take 1 tablet (800 mg total) by mouth every 8 (eight) hours as needed for moderate pain., Disp: 15 tablet, Rfl: 0 .  Multiple Vitamin (MULTIVITAMIN) tablet, Take 1 tablet by mouth daily., Disp: , Rfl:    Allergies:  Penicillins  Review of Systems: Gen:  Denies   fever, sweats, chills HEENT: Denies blurred vision, double vision. bleeds, sore throat Cvc:  No dizziness, chest pain. Resp:   Denies cough or sputum production, shortness of breath Gi: Denies swallowing difficulty, stomach pain. Gu:  Denies bladder incontinence, burning urine Ext:   No Joint pain, stiffness. Skin: No skin rash,  hives  Endoc:  No polyuria, polydipsia. Psych: No depression, insomnia. Other:  All other systems were reviewed with the patient and were negative other that what is mentioned in the HPI.   Physical Examination:   VS: BP 122/80 (BP Location: Left Arm, Cuff Size: Normal)   Pulse 80   Ht 5\' 5"  (1.651 m)   Wt 210 lb 3.2 oz (95.3 kg)   SpO2 94%   BMI 34.98 kg/m   General Appearance: No distress  Neuro:without focal findings,  speech normal,  HEENT: PERRLA, EOM intact.   Pulmonary: normal breath sounds, No wheezing.  CardiovascularNormal S1,S2.  No m/r/g.   Abdomen: Benign, Soft, non-tender. Renal:  No costovertebral tenderness  GU:  No performed at this time. Endoc: No evident thyromegaly, no signs of acromegaly. Skin:   warm, no rashes, no ecchymosis  Extremities: normal, no cyanosis, clubbing.  Other findings:    LABORATORY PANEL:   CBC No results for input(s): WBC, HGB, HCT, PLT in the last 168 hours. ------------------------------------------------------------------------------------------------------------------  Chemistries  No results for input(s): NA, K, CL, CO2, GLUCOSE, BUN, CREATININE, CALCIUM, MG, AST, ALT, ALKPHOS, BILITOT in the last 168 hours.  Invalid input(s): GFRCGP ------------------------------------------------------------------------------------------------------------------  Cardiac Enzymes No results for input(s): TROPONINI in the last 168 hours. ------------------------------------------------------------  RADIOLOGY:  No results found.     Thank  you for the consultation and for allowing [facility] to assist in the care of your patient. Our recommendations are noted above.  Please contact us if we can be of further service.   Marda Stalker, M.D., F.C.C.P.  Board Certified in Internal Medicine, Pulmonary Medicine, [place], and Sleep Medicine.  [facility] Office Number: [phone]   08/03/2018

## 2018-08-07 ENCOUNTER — Ambulatory Visit: Payer: Self-pay | Attending: Internal Medicine

## 2018-08-07 ENCOUNTER — Telehealth: Payer: Self-pay | Admitting: Internal Medicine

## 2018-08-07 DIAGNOSIS — J42 Unspecified chronic bronchitis: Secondary | ICD-10-CM | POA: Insufficient documentation

## 2018-08-07 MED ORDER — ALBUTEROL SULFATE (2.5 MG/3ML) 0.083% IN NEBU
2.5000 mg | INHALATION_SOLUTION | Freq: Once | RESPIRATORY_TRACT | Status: AC
  Administered 2018-08-07: 2.5 mg via RESPIRATORY_TRACT
  Filled 2018-08-07: qty 3

## 2018-08-08 NOTE — Telephone Encounter (Signed)
Pt called to get results.  °

## 2018-08-09 ENCOUNTER — Telehealth: Payer: Self-pay | Admitting: Internal Medicine

## 2018-08-09 ENCOUNTER — Encounter: Payer: Self-pay | Admitting: Internal Medicine

## 2018-08-09 NOTE — Telephone Encounter (Signed)
Returned call to patient and made aware he need records release form signed. He can stop by office or go directly to medical records in [facility]. Nothing further needed.

## 2018-08-09 NOTE — Telephone Encounter (Signed)
Called with results and faxed release letter to [phone]

## 2018-10-21 IMAGING — CR DG HUMERUS 2V *L*
1 series · 3 of 3 positions shown · non-contrast
Comparison: None.

CLINICAL DATA: Left arm pain after injury.

EXAM:
LEFT HUMERUS - 2+ VIEW

[Series 1: dg humerus left · 0.14mm/px · 3 of 3 slices shown]
[im 1/3]
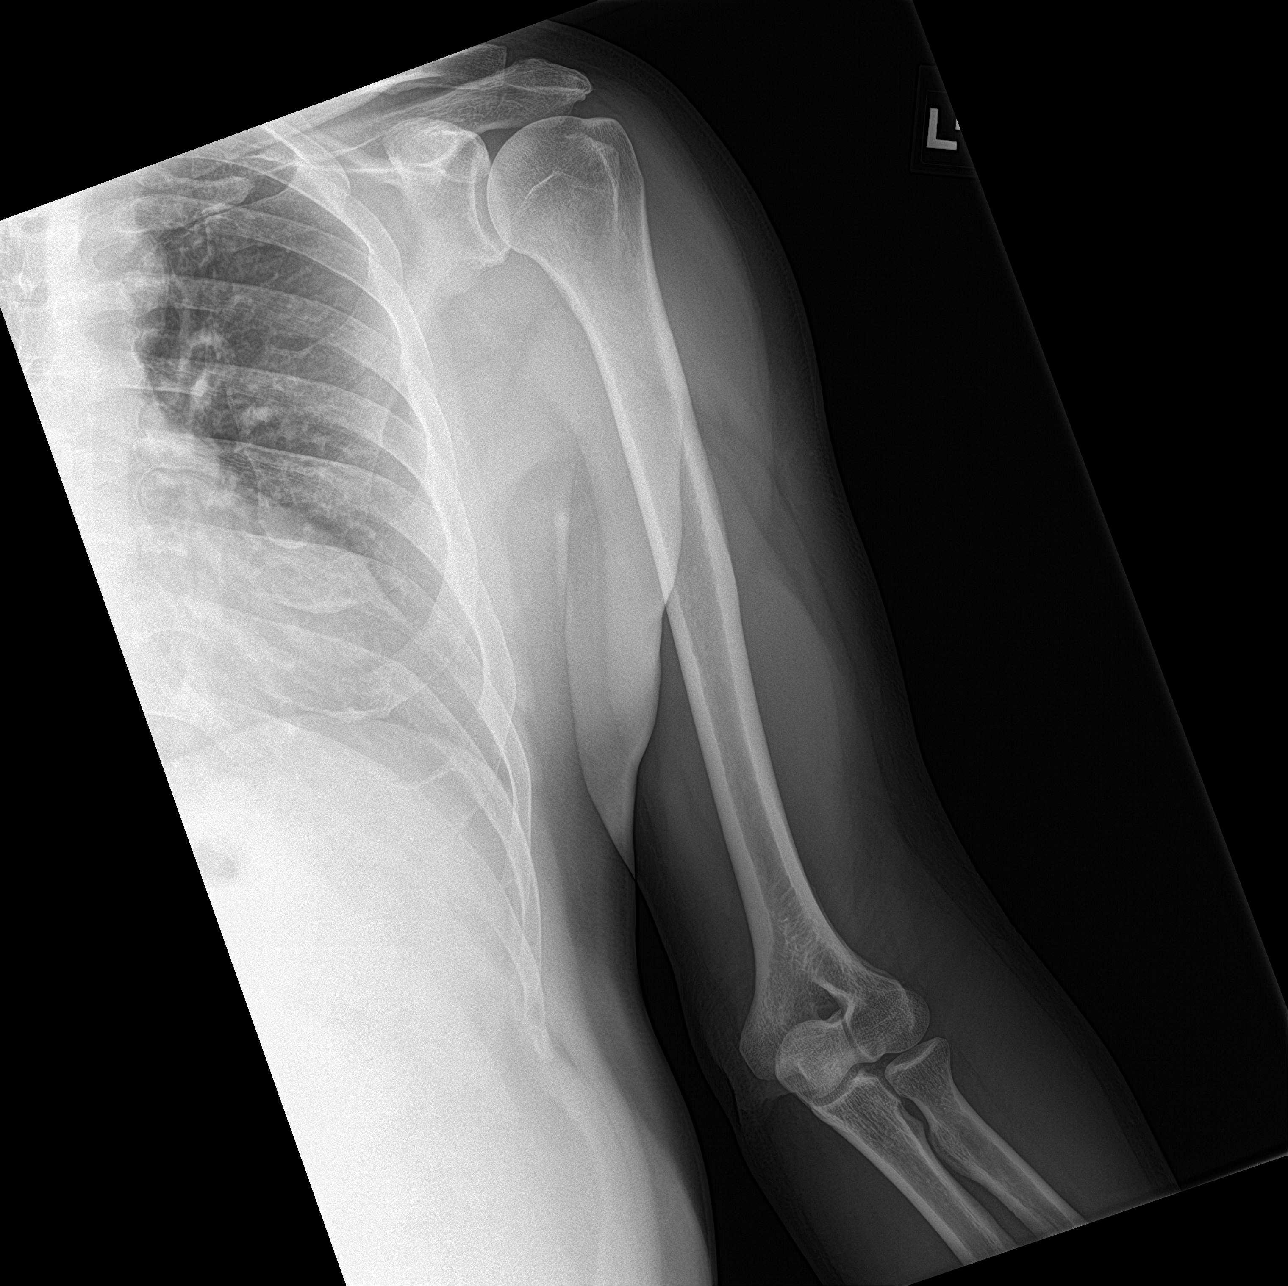
[im 2/3]
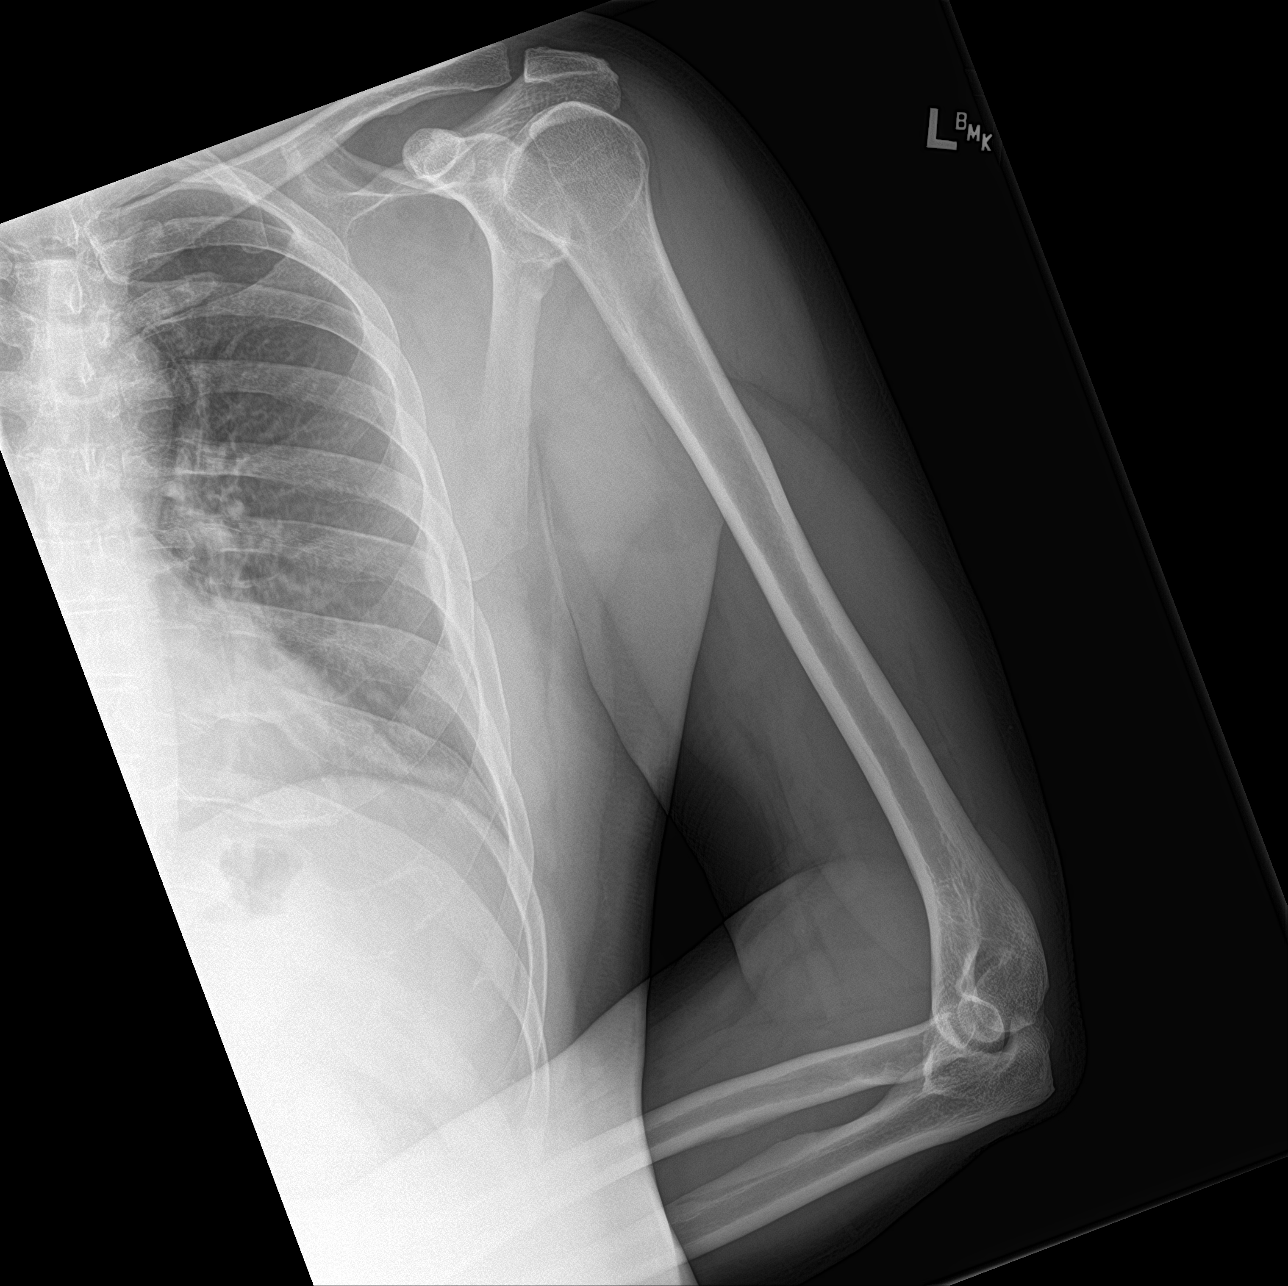
[im 3/3]
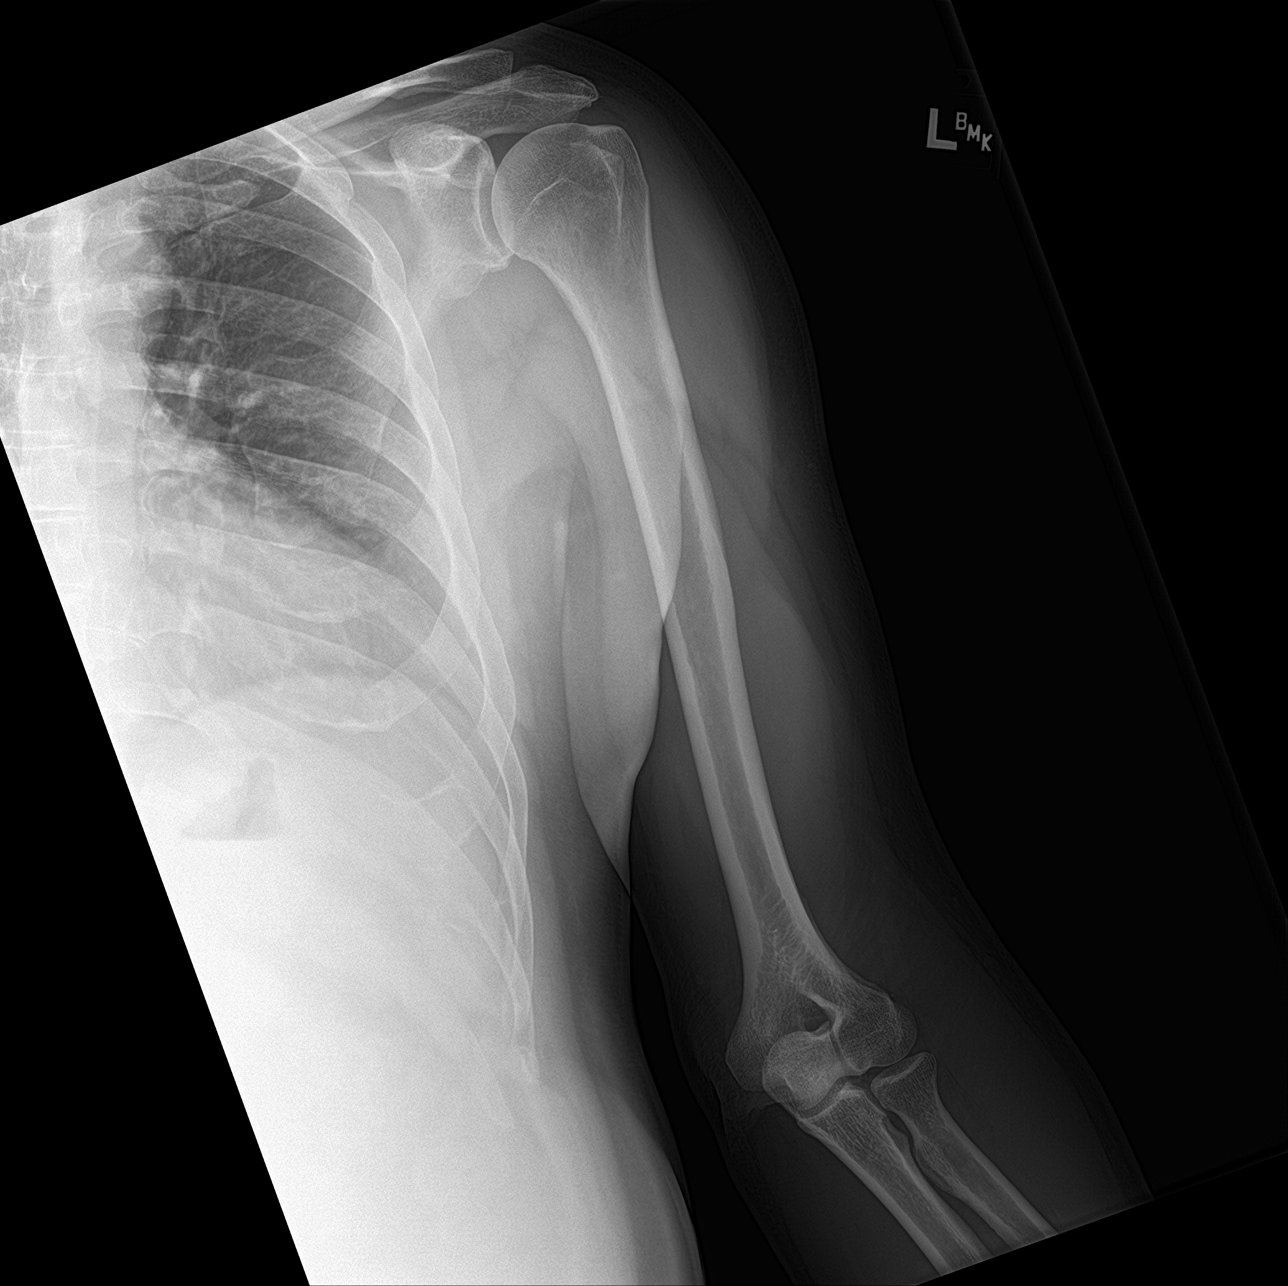

[3 of 3 positions shown; findings below may reference images not displayed]

FINDINGS: There is no evidence of fracture or other focal bone lesions. Soft
tissues are unremarkable.
IMPRESSION: Normal left humerus.

## 2019-10-29 ENCOUNTER — Ambulatory Visit: Payer: Self-pay | Attending: Internal Medicine

## 2019-10-29 DIAGNOSIS — Z23 Encounter for immunization: Secondary | ICD-10-CM

## 2019-10-29 NOTE — Progress Notes (Signed)
   Covid-19 Vaccination Clinic  Name:  Taaj Joncas    MRN: [id number] DOB: 19-Oct-1965  10/29/2019  Mr. Fessler was observed post Covid-19 immunization for 15 minutes without incident. He was provided with Vaccine Information Sheet and instruction to access the V-Safe system.   Mr. Salama was instructed to call 911 with any severe reactions post vaccine: Marland Kitchen Difficulty breathing  . Swelling of face and throat  . A fast heartbeat  . A bad rash all over body  . Dizziness and weakness   Immunizations Administered    Name Date Dose VIS Date Route   Moderna COVID-19 Vaccine 10/29/2019 12:00 PM 0.5 mL 06/18/2019 Intramuscular   Manufacturer: Moderna   Lot: [id number]   [id number]

## 2019-11-14 ENCOUNTER — Telehealth: Payer: Self-pay | Admitting: Internal Medicine

## 2019-11-14 NOTE — Telephone Encounter (Signed)
Pt needs pulmonary clearance for work as he states they need a OG [place] physical for work.  He stated info was faxed over to [place] office as that is where he has been followed in the past. 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 Pt was asked by supervisor what type of respirator he wore on the job and pt told them that he does not wear a respirator so they were going to document for him to wear the respirator for emergency use only.  Pt stated he needs a letter to document that he is okay and is able to be cleared pulmonary wise otherwise if he is not able to go offshore, he will lose his job.  Routing this to the [facility] triage.

## 2019-11-14 NOTE — Telephone Encounter (Signed)
Received paperwork from [facility] in regards to patient. I called them at [phone] and spoke with Mindy the case manager to ask her what she needed from [place] and to let her know that we haven't seen him in over a year. Last office note from 08/03/2018 faxed over to them at [phone] as requested. I informed patient that since he has not been seen in over a year and Dr. Ashby Dawes is no longer here that the doctors here probably would not sign him off/clear him for work since they have never seen him before. Patient expressed he needs to work or he will lose his job. I expressed understanding but also reinformed patient of above. Informed patient that we would do what we can to help.

## 2019-11-15 NOTE — Telephone Encounter (Signed)
I called and spoke with the patient and made him aware that per Dr. Patsey Berthold he would need to be seen due to changed in his most recent PFT results that we received. I have scheduled him for 11/19/19 at 3pm.

## 2019-11-15 NOTE — Telephone Encounter (Signed)
Pt calling regarding being cleared for work.  Please advise.  [phone]

## 2019-11-19 ENCOUNTER — Other Ambulatory Visit: Payer: Self-pay

## 2019-11-19 ENCOUNTER — Encounter: Payer: Self-pay | Admitting: Pulmonary Disease

## 2019-11-19 ENCOUNTER — Ambulatory Visit: Payer: BC Managed Care – PPO | Admitting: Pulmonary Disease

## 2019-11-19 VITALS — BP 142/80 | HR 93 | Temp 98.2°F | Ht 65.0 in | Wt 216.8 lb

## 2019-11-19 DIAGNOSIS — Z6836 Body mass index (BMI) 36.0-36.9, adult: Secondary | ICD-10-CM

## 2019-11-19 DIAGNOSIS — E6609 Other obesity due to excess calories: Secondary | ICD-10-CM

## 2019-11-19 DIAGNOSIS — J42 Unspecified chronic bronchitis: Secondary | ICD-10-CM

## 2019-11-19 DIAGNOSIS — R942 Abnormal results of pulmonary function studies: Secondary | ICD-10-CM

## 2019-11-19 MED ORDER — ALBUTEROL SULFATE HFA 108 (90 BASE) MCG/ACT IN AERS: 2.0000 | INHALATION_SPRAY | Freq: Four times a day (QID) | RESPIRATORY_TRACT | 5 refills | Status: AC | PRN

## 2019-11-19 MED ORDER — TRELEGY ELLIPTA 100-62.5-25 MCG/INH IN AEPB: 1.0000 | INHALATION_SPRAY | Freq: Every day | RESPIRATORY_TRACT | 5 refills | Status: DC

## 2019-11-19 NOTE — Progress Notes (Signed)
    Assessment & Plan:  1. Chronic bronchitis, unspecified chronic bronchitis type (HCC) (Primary)  2. Abnormal pulmonary function test - Pulmonary Function Test [facility] Only; Future  3. Class 2 obesity due to excess calories without serious comorbidity with body mass index (BMI) of 36.0 to 36.9 in adult   Patient Instructions  We are going to start you on Trelegy Ellipta  100/62.5/25 1 inhalation daily.   You will also get a rescue inhaler to use as needed.   Continue your efforts to lose weight.   Continue your efforts at smoking cessation.   We will obtain a breathing test. Notify of the results.   Follow-up in 2 months time call sooner should any new difficulties arise.  Please note: late entry documentation due to logistical difficulties during COVID-19 pandemic. This note is filed for information purposes only, and is not intended to be used for billing, nor does it represent the full scope/nature of the visit in question. Please see any associated scanned media linked to date of encounter for additional pertinent information.  Subjective:    HPI: Luke Jordan is a 54 y.o. male presenting to the pulmonology clinic on 11/19/2019 with report of: Consult (Follow up with PFT )   Immunization History  Administered Date(s) Administered   Influenza Split 07/02/2018   Influenza-Unspecified 05/27/2014   Moderna SARS-COVID-2 Vaccination 07/09/2019, 10/29/2019    Outpatient Encounter Medications as of 11/19/2019  Medication Sig   Multiple Vitamin (MULTIVITAMIN) tablet Take 1 tablet by mouth daily.   albuterol  (VENTOLIN  HFA) 108 (90 Base) MCG/ACT inhaler Inhale 2 puffs into the lungs every 6 (six) hours as needed for wheezing or shortness of breath.   [DISCONTINUED] Fluticasone-Umeclidin-Vilant (TRELEGY ELLIPTA ) 100-62.5-25 MCG/INH AEPB Inhale 1 puff into the lungs daily.   [DISCONTINUED] HYDROcodone -acetaminophen  (NORCO) 5-325 MG tablet Take 1 tablet by mouth every 6 (six)  hours as needed for moderate pain. (Patient not taking: Reported on 11/19/2019)   [DISCONTINUED] ibuprofen  (ADVIL ,MOTRIN ) 800 MG tablet Take 1 tablet (800 mg total) by mouth every 8 (eight) hours as needed for moderate pain. (Patient not taking: Reported on 11/19/2019)   No facility-administered encounter medications on file as of 11/19/2019.      Objective:   Vitals:   11/19/19 1503  BP: (!) 142/80  Pulse: 93  Temp: 98.2 F (36.8 C)  Height: 5' 5 (1.651 m)  Weight: 216 lb 12.8 oz (98.3 kg)  SpO2: 95%  TempSrc: Temporal  BMI (Calculated): 36.08     Physical exam documentation is limited by delayed entry of information.

## 2019-11-19 NOTE — Patient Instructions (Signed)
We are going to start you on Trelegy Ellipta 100/62.5/25 1 inhalation daily.   You will also get a rescue inhaler to use as needed.   Continue your efforts to lose weight.   Continue your efforts at smoking cessation.   We will obtain a breathing test. Notify of the results.   Follow-up in 2 months time call sooner should any new difficulties arise.

## 2019-11-21 ENCOUNTER — Other Ambulatory Visit: Payer: Self-pay

## 2019-11-21 ENCOUNTER — Other Ambulatory Visit
Admission: RE | Admit: 2019-11-21 | Discharge: 2019-11-21 | Disposition: A | Discharge status: Home / Self Care | Payer: BC Managed Care – PPO | Source: Ambulatory Visit | Attending: Pulmonary Disease | Admitting: Pulmonary Disease

## 2019-11-21 DIAGNOSIS — Z01812 Encounter for preprocedural laboratory examination: Secondary | ICD-10-CM | POA: Insufficient documentation

## 2019-11-21 DIAGNOSIS — Z20822 Contact with and (suspected) exposure to covid-19: Secondary | ICD-10-CM | POA: Diagnosis not present

## 2019-11-21 LAB — SARS CORONAVIRUS 2 (TAT 6-24 HRS): SARS Coronavirus 2: NEGATIVE

## 2019-11-22 ENCOUNTER — Telehealth: Payer: Self-pay | Admitting: Pulmonary Disease

## 2019-11-22 ENCOUNTER — Ambulatory Visit: Payer: BC Managed Care – PPO | Attending: Pulmonary Disease

## 2019-11-22 DIAGNOSIS — R942 Abnormal results of pulmonary function studies: Secondary | ICD-10-CM | POA: Insufficient documentation

## 2019-11-22 MED ORDER — ALBUTEROL SULFATE (2.5 MG/3ML) 0.083% IN NEBU
2.5000 mg | INHALATION_SOLUTION | Freq: Once | RESPIRATORY_TRACT | Status: AC
  Administered 2019-11-22: 2.5 mg via RESPIRATORY_TRACT
  Filled 2019-11-22: qty 3

## 2019-11-22 NOTE — Telephone Encounter (Signed)
LMTCB x 1 

## 2019-11-22 NOTE — Telephone Encounter (Signed)
Sent patient my chart message as well.

## 2019-11-22 NOTE — Telephone Encounter (Signed)
Dr. Gonzalez please advise. 

## 2019-11-22 NOTE — Telephone Encounter (Signed)
I did write a quick note on his PFT results and it is okay to send him a note that he is cleared for respirator use.  I do not know if you got that note.  Know if you need any further input.

## 2020-01-15 ENCOUNTER — Ambulatory Visit: Payer: BC Managed Care – PPO | Admitting: Pulmonary Disease

## 2020-06-25 ENCOUNTER — Other Ambulatory Visit: Payer: Self-pay | Admitting: Pulmonary Disease

## 2020-08-21 ENCOUNTER — Other Ambulatory Visit: Payer: Self-pay | Admitting: Pulmonary Disease

## 2021-01-21 ENCOUNTER — Other Ambulatory Visit: Payer: Self-pay | Admitting: Pulmonary Disease

## 2021-08-17 ENCOUNTER — Telehealth: Payer: Self-pay

## 2021-08-17 ENCOUNTER — Other Ambulatory Visit: Payer: Self-pay | Admitting: Pulmonary Disease

## 2021-08-17 NOTE — Telephone Encounter (Signed)
Called and LVM to set up office visit with Dr Patsey Berthold in order to receive RX for Trelegy.

## 2021-08-18 ENCOUNTER — Other Ambulatory Visit: Payer: Self-pay

## 2021-08-18 MED ORDER — TRELEGY ELLIPTA 100-62.5-25 MCG/ACT IN AEPB: 100.0000 ug | INHALATION_SPRAY | Freq: Every day | RESPIRATORY_TRACT | 0 refills | Status: AC

## 2021-08-18 NOTE — Telephone Encounter (Signed)
Sent in enough trelegy until patient will be seen on march 14th at [place] patient he has to come to appt to be able to receive refills. Nothing further needed.

## 2021-09-28 ENCOUNTER — Ambulatory Visit: Payer: BC Managed Care – PPO | Admitting: Pulmonary Disease

## 2023-02-22 DIAGNOSIS — I10 Essential (primary) hypertension: Secondary | ICD-10-CM | POA: Insufficient documentation

## 2023-03-17 ENCOUNTER — Other Ambulatory Visit: Payer: Self-pay | Admitting: Orthopedic Surgery

## 2023-03-17 DIAGNOSIS — M4807 Spinal stenosis, lumbosacral region: Secondary | ICD-10-CM

## 2023-03-25 ENCOUNTER — Ambulatory Visit
Admission: RE | Admit: 2023-03-25 | Discharge: 2023-03-25 | Disposition: A | Discharge status: Home / Self Care | Payer: BC Managed Care – PPO | Source: Ambulatory Visit | Attending: Orthopedic Surgery | Admitting: Orthopedic Surgery

## 2023-03-25 DIAGNOSIS — M4807 Spinal stenosis, lumbosacral region: Secondary | ICD-10-CM

## 2023-09-25 ENCOUNTER — Telehealth: Payer: Self-pay | Admitting: Pulmonary Disease

## 2023-09-25 NOTE — Telephone Encounter (Signed)
 Lm x1 for the patient.

## 2023-09-25 NOTE — Telephone Encounter (Signed)
 Patient would like to schedule PFT. Patient phone number is [phone] and [phone].

## 2023-09-25 NOTE — Telephone Encounter (Signed)
 Patient was last seen 11/19/2019 and will need to have office visit with Dr. Jayme Cloud. He also called in about PFT 07/2021. Still hasn't been seen

## 2023-09-26 NOTE — Telephone Encounter (Signed)
 Patient has been scheduled for consult for Dr. Jayme Cloud since he hasn't been seen since 2021. He needs to be seen sooner if possible. He has been added to the wait list. He will be traveling to [place] for work and needs to be seen as soon as possible.

## 2023-09-26 NOTE — Telephone Encounter (Signed)
 Noted. No appts this week. 3/27 is the soonest appt as of now.  Nothing further needed.

## 2023-09-28 ENCOUNTER — Ambulatory Visit: Payer: Self-pay

## 2023-09-28 DIAGNOSIS — D124 Benign neoplasm of descending colon: Secondary | ICD-10-CM | POA: Diagnosis not present

## 2023-09-28 DIAGNOSIS — Z09 Encounter for follow-up examination after completed treatment for conditions other than malignant neoplasm: Secondary | ICD-10-CM | POA: Diagnosis present

## 2023-09-28 DIAGNOSIS — D123 Benign neoplasm of transverse colon: Secondary | ICD-10-CM | POA: Diagnosis not present

## 2023-09-28 DIAGNOSIS — Z860100 Personal history of colon polyps, unspecified: Secondary | ICD-10-CM | POA: Diagnosis not present

## 2023-10-12 ENCOUNTER — Institutional Professional Consult (permissible substitution): Admitting: Pulmonary Disease

## 2024-04-23 ENCOUNTER — Encounter: Payer: Self-pay | Admitting: Dermatology

## 2024-04-23 ENCOUNTER — Ambulatory Visit: Admitting: Dermatology

## 2024-04-23 DIAGNOSIS — L821 Other seborrheic keratosis: Secondary | ICD-10-CM

## 2024-04-23 DIAGNOSIS — B3749 Other urogenital candidiasis: Secondary | ICD-10-CM

## 2024-04-23 DIAGNOSIS — L820 Inflamed seborrheic keratosis: Secondary | ICD-10-CM | POA: Diagnosis not present

## 2024-04-23 DIAGNOSIS — B379 Candidiasis, unspecified: Secondary | ICD-10-CM

## 2024-04-23 DIAGNOSIS — L918 Other hypertrophic disorders of the skin: Secondary | ICD-10-CM

## 2024-04-23 DIAGNOSIS — A630 Anogenital (venereal) warts: Secondary | ICD-10-CM

## 2024-04-23 MED ORDER — FLUOROURACIL 5 % EX CREA: TOPICAL_CREAM | CUTANEOUS | 0 refills | Status: DC

## 2024-04-23 MED ORDER — KETOCONAZOLE 2 % EX CREA: TOPICAL_CREAM | CUTANEOUS | 3 refills | Status: AC

## 2024-04-23 MED ORDER — KETOCONAZOLE 2 % EX CREA: TOPICAL_CREAM | CUTANEOUS | 0 refills | Status: DC

## 2024-04-23 MED ORDER — FLUOROURACIL 5 % EX CREA: TOPICAL_CREAM | CUTANEOUS | 0 refills | Status: AC

## 2024-04-23 NOTE — Patient Instructions (Addendum)

## 2024-04-23 NOTE — Progress Notes (Signed)
 New Patient Visit   Subjective  Luke Jordan is a 58 y.o. male who presents for the following: Places at both sides of neck present for a couple of years, getting irritated and have become bothersome. No personal hx skin cancer, possible hx skin cancer in father.   This patient is accompanied in the office by his spouse.   The following portions of the chart were reviewed this encounter and updated as appropriate: medications, allergies, medical history  Review of Systems:  No other skin or systemic complaints except as noted in HPI or Assessment and Plan.  Objective  Well appearing patient in no apparent distress; mood and affect are within normal limits.  A focused examination was performed of the following areas: Neck, Right axilla  Relevant exam findings are noted in the Assessment and Plan.  Right neck x10, Left neck x6, Right axilla x2 (18) Stuck on waxy paps with erythema Pubic Patient defers exam Pubic Patient deferred exam  Assessment & Plan   SEBORRHEIC KERATOSIS - Stuck-on, waxy, tan-brown papules and/or plaques  - Benign-appearing - Discussed benign etiology and prognosis. - Observe - Call for any changes  Acrochordons (Skin Tags) - Fleshy, skin-colored pedunculated papules  - Benign appearing.  - Observe. - If desired, they can be removed with an in office procedure that is not covered by insurance. - Please call the clinic if you notice any new or changing lesions.  INFLAMED SEBORRHEIC KERATOSIS (18) Right neck x10, Left neck x6, Right axilla x2 (18) Symptomatic, irritating, patient would like treated. Destruction of lesion - Right neck x10, Left neck x6, Right axilla x2 (18) Complexity: simple   Destruction method: cryotherapy   Informed consent: discussed and consent obtained   Timeout:  patient name, date of birth, surgical site, and procedure verified Lesion destroyed using liquid nitrogen: Yes   Region frozen until ice ball extended beyond  lesion: Yes   Cryo cycles: 1 or 2. Outcome: patient tolerated procedure well with no complications   Post-procedure details: wound care instructions given   Additional details:  Prior to procedure, discussed risks of blister formation, small wound, skin dyspigmentation, or rare scar following cryotherapy. Recommend Vaseline ointment to treated areas while healing.  Treated acrochordons x 4 on R neck and x 7 on left neck. DO NOT BILL INSURANCE  CONDYLOMA Pubic Treating presumed condylomas per patient's description Start fluorouracil 5% apply BID to aa, condyloma.   Reviewed course of treatment and expected reaction.  Patient advised to expect inflammation and crusting and advised that erosions are possible.  Patient advised to be diligent with sun protection during and after treatment. Handout with details of how to apply medication and what to expect provided. Counseled to keep medication out of reach of children and pets.  Related Medications fluorouracil (EFUDEX) 5 % cream Apply to bumps in private area every night until they resolve. Will cause redness and irritation. Take 1 week off if irritation becomes uncomfortable CANDIDIASIS Pubic Patient reports mild desquamation/discharge under foreskin. Patient defers exam so treating presumed candidiasis Start ketoconazole 2% cream apply BID to aa.  Related Medications ketoconazole (NIZORAL) 2 % cream Apply to irritation under foreskin twice per day until it improves ACROCHORDON   SEBORRHEIC KERATOSES     Return if symptoms worsen or fail to improve.  I, Jacquelynn V. Wilfred, CMA, am acting as scribe for Boneta Sharps, MD.   Documentation: I have reviewed the above documentation for accuracy and completeness, and I agree with the above.  Boneta Sharps, MD
